# Patient Record
Sex: Male | Born: 1955 | Race: White | Hispanic: No | Marital: Married | Smoking: Never smoker
Health system: Southern US, Community
[De-identification: ages and names within clinical notes are randomized; demographics above are authoritative.]

## PROBLEM LIST (undated history)

## (undated) DIAGNOSIS — I10 Essential (primary) hypertension: Secondary | ICD-10-CM

## (undated) DIAGNOSIS — R778 Other specified abnormalities of plasma proteins: Secondary | ICD-10-CM

## (undated) DIAGNOSIS — D126 Benign neoplasm of colon, unspecified: Secondary | ICD-10-CM

## (undated) DIAGNOSIS — R6882 Decreased libido: Secondary | ICD-10-CM

## (undated) DIAGNOSIS — R432 Parageusia: Secondary | ICD-10-CM

## (undated) DIAGNOSIS — E519 Thiamine deficiency, unspecified: Secondary | ICD-10-CM

## (undated) DIAGNOSIS — D509 Iron deficiency anemia, unspecified: Secondary | ICD-10-CM

## (undated) DIAGNOSIS — F32A Depression, unspecified: Secondary | ICD-10-CM

## (undated) DIAGNOSIS — F329 Major depressive disorder, single episode, unspecified: Secondary | ICD-10-CM

## (undated) DIAGNOSIS — K449 Diaphragmatic hernia without obstruction or gangrene: Secondary | ICD-10-CM

## (undated) HISTORY — DX: Parageusia: R43.2

## (undated) HISTORY — DX: Other specified abnormalities of plasma proteins: R77.8

## (undated) HISTORY — PX: BACK SURGERY: SHX140

## (undated) HISTORY — DX: Diaphragmatic hernia without obstruction or gangrene: K44.9

## (undated) HISTORY — DX: Essential (primary) hypertension: I10

## (undated) HISTORY — DX: Decreased libido: R68.82

## (undated) HISTORY — DX: Thiamine deficiency, unspecified: E51.9

## (undated) HISTORY — DX: Iron deficiency anemia, unspecified: D50.9

## (undated) HISTORY — DX: Depression, unspecified: F32.A

## (undated) HISTORY — PX: TOE AMPUTATION: SHX809

---
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
## 2001-02-16 DIAGNOSIS — D126 Benign neoplasm of colon, unspecified: Secondary | ICD-10-CM

## 2001-02-16 HISTORY — DX: Benign neoplasm of colon, unspecified: D12.6

## 2006-08-24 ENCOUNTER — Encounter: Admission: RE | Admit: 2006-08-24 | Discharge: 2006-08-24 | Payer: Self-pay | Admitting: Neurosurgery

## 2008-06-04 ENCOUNTER — Encounter
Admission: RE | Admit: 2008-06-04 | Discharge: 2008-09-02 | Payer: Self-pay | Admitting: Physical Medicine & Rehabilitation

## 2008-06-06 ENCOUNTER — Ambulatory Visit: Payer: Self-pay | Admitting: Physical Medicine & Rehabilitation

## 2008-07-11 ENCOUNTER — Ambulatory Visit: Payer: Self-pay | Admitting: Physical Medicine & Rehabilitation

## 2008-07-26 ENCOUNTER — Encounter: Admission: RE | Admit: 2008-07-26 | Discharge: 2008-10-24 | Payer: Self-pay | Admitting: Anesthesiology

## 2008-07-31 ENCOUNTER — Ambulatory Visit: Payer: Self-pay | Admitting: Anesthesiology

## 2008-09-11 ENCOUNTER — Ambulatory Visit: Payer: Self-pay | Admitting: Anesthesiology

## 2008-10-08 ENCOUNTER — Encounter
Admission: RE | Admit: 2008-10-08 | Discharge: 2008-10-10 | Payer: Self-pay | Admitting: Physical Medicine & Rehabilitation

## 2008-10-10 ENCOUNTER — Ambulatory Visit: Payer: Self-pay | Admitting: Physical Medicine & Rehabilitation

## 2009-01-01 ENCOUNTER — Encounter
Admission: RE | Admit: 2009-01-01 | Discharge: 2009-02-07 | Payer: Self-pay | Admitting: Physical Medicine & Rehabilitation

## 2009-01-02 ENCOUNTER — Ambulatory Visit: Payer: Self-pay | Admitting: Physical Medicine & Rehabilitation

## 2009-01-09 ENCOUNTER — Encounter
Admission: RE | Admit: 2009-01-09 | Discharge: 2009-01-09 | Payer: Self-pay | Admitting: Physical Medicine & Rehabilitation

## 2009-02-22 ENCOUNTER — Encounter
Admission: RE | Admit: 2009-02-22 | Discharge: 2009-05-23 | Payer: Self-pay | Admitting: Physical Medicine & Rehabilitation

## 2009-02-25 ENCOUNTER — Ambulatory Visit: Payer: Self-pay | Admitting: Physical Medicine & Rehabilitation

## 2009-03-15 ENCOUNTER — Encounter
Admission: RE | Admit: 2009-03-15 | Discharge: 2009-03-15 | Payer: Self-pay | Admitting: Physical Medicine & Rehabilitation

## 2009-03-18 ENCOUNTER — Encounter
Admission: RE | Admit: 2009-03-18 | Discharge: 2009-06-16 | Payer: Self-pay | Admitting: Physical Medicine & Rehabilitation

## 2009-03-18 ENCOUNTER — Ambulatory Visit: Payer: Self-pay | Admitting: Physical Medicine & Rehabilitation

## 2009-04-15 ENCOUNTER — Ambulatory Visit: Payer: Self-pay | Admitting: Physical Medicine & Rehabilitation

## 2009-05-20 ENCOUNTER — Ambulatory Visit: Payer: Self-pay | Admitting: Physical Medicine & Rehabilitation

## 2009-06-17 ENCOUNTER — Encounter
Admission: RE | Admit: 2009-06-17 | Discharge: 2009-06-17 | Payer: Self-pay | Admitting: Physical Medicine & Rehabilitation

## 2009-06-17 ENCOUNTER — Ambulatory Visit: Payer: Self-pay | Admitting: Physical Medicine & Rehabilitation

## 2009-06-26 ENCOUNTER — Inpatient Hospital Stay (HOSPITAL_COMMUNITY): Admission: RE | Admit: 2009-06-26 | Discharge: 2009-07-01 | Payer: Self-pay | Admitting: Neurosurgery

## 2009-12-13 ENCOUNTER — Encounter: Admission: RE | Admit: 2009-12-13 | Discharge: 2009-12-13 | Payer: Self-pay | Admitting: Neurosurgery

## 2010-03-08 ENCOUNTER — Encounter: Payer: Self-pay | Admitting: Neurosurgery

## 2010-03-09 ENCOUNTER — Encounter: Payer: Self-pay | Admitting: Neurosurgery

## 2010-04-26 ENCOUNTER — Emergency Department (HOSPITAL_BASED_OUTPATIENT_CLINIC_OR_DEPARTMENT_OTHER)
Admission: EM | Admit: 2010-04-26 | Discharge: 2010-04-26 | Disposition: A | Discharge status: Home / Self Care | Payer: BC Managed Care – PPO | Attending: Emergency Medicine | Admitting: Emergency Medicine

## 2010-04-26 DIAGNOSIS — W899XXA Exposure to unspecified man-made visible and ultraviolet light, initial encounter: Secondary | ICD-10-CM | POA: Insufficient documentation

## 2010-04-26 DIAGNOSIS — Y92410 Unspecified street and highway as the place of occurrence of the external cause: Secondary | ICD-10-CM | POA: Insufficient documentation

## 2010-04-26 DIAGNOSIS — G8929 Other chronic pain: Secondary | ICD-10-CM | POA: Insufficient documentation

## 2010-04-26 DIAGNOSIS — I10 Essential (primary) hypertension: Secondary | ICD-10-CM | POA: Insufficient documentation

## 2010-04-26 DIAGNOSIS — L568 Other specified acute skin changes due to ultraviolet radiation: Secondary | ICD-10-CM | POA: Insufficient documentation

## 2010-05-06 LAB — DIFFERENTIAL
Eosinophils Absolute: 0 10*3/uL (ref 0.0–0.7)
Lymphocytes Relative: 8 % — ABNORMAL LOW (ref 12–46)
Lymphs Abs: 1.2 10*3/uL (ref 0.7–4.0)
Monocytes Relative: 1 % — ABNORMAL LOW (ref 3–12)
Neutrophils Relative %: 91 % — ABNORMAL HIGH (ref 43–77)

## 2010-05-06 LAB — BASIC METABOLIC PANEL
Chloride: 97 mEq/L (ref 96–112)
GFR calc Af Amer: >60 mL/min (ref >60)
GFR calc non Af Amer: >60 mL/min (ref >60)
Potassium: 3.8 mEq/L (ref 3.5–5.1)

## 2010-05-06 LAB — TYPE AND SCREEN: Antibody Screen: NEGATIVE

## 2010-05-06 LAB — CBC
HCT: 39.5 % (ref 39.0–52.0)
MCV: 91.1 fL (ref 78.0–100.0)
RBC: 4.34 MIL/uL (ref 4.22–5.81)
WBC: 14.5 10*3/uL — ABNORMAL HIGH (ref 4.0–10.5)

## 2010-05-06 LAB — SURGICAL PCR SCREEN
MRSA, PCR: NEGATIVE
Staphylococcus aureus: POSITIVE — AB

## 2010-07-01 NOTE — Procedures (Signed)
NAMECLAUDELL, WOHLER NO.:  [id number]   MEDICAL RECORD NO.:  [id number]          PATIENT TYPE:  REC   LOCATION:  [facility]                          FACILITY:  [facility]   PHYSICIAN:  Celene Kras, MD        DATE OF BIRTH:  27-Mar-1955   DATE OF PROCEDURE:  DATE OF DISCHARGE:                               OPERATIVE REPORT   Luke [facility].  I evaluated  him and reviewed the health and history form and 14-point review of  systems.  1. I have examined him, reviewed the chart, progress data, overall      directed care approach, reviewed imaging.  I think it is reasonable      to go on to facet medial branch intervention, concur with Dr.      Riley Kill, 5 and 4 most problematic levels with 3 as contributory      innervation.  I will also inject dorsal rami, 5, independent needle      access points.  He has consented.  I have used models and discussed      in lay terms.  I have given benchmarks, consider RF as an option      down the road.  2. Maintain contact with Dr. Riley Kill.  3. Follow expectantly.  Questions were answered.   OBJECTIVE:  Diffuse paralumbar myofascial, Fortin test positive with  side bending, range of motion impaired secondary to pain.  Pain with  extension.  Nothing new neurologically.   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   PLAN:  Facet medial branch intervention 5, 4, 3, right and left side  independent needle access points with contributory elements addressed,  dorsal rami 5, 5 right and left.  He has consented.   The patient was taken to the fluoroscopy suite and placed in the prone  position.  Back prepped and draped in usual fashion using a 22-gauge  spinal needle, I advanced to the facet at the medial branch, 5, 4, and  3, dorsal rami 5 right and left side.  Confirmed placement.  Then,  inject 1 mL of lidocaine 1% MPF at each level, medial branch,  independent needle access points, with a total of 40 mg Aristocort in  divided dose.   He tolerated the procedure well.  No complications from our procedure.  Discharge instructions given.  We will see him in followup.  Predicate  further intervention based on need and overall response.           ______________________________  Celene Kras, MD     HH/MEDQ  D:  07/31/2008 13:15:22  T:  08/01/2008 06:47:38  Job:  [id number]

## 2010-07-01 NOTE — Procedures (Signed)
NAMEGIOVANNE, Odonnell NO.:  [id number]   MEDICAL RECORD NO.:  [id number]           PATIENT TYPE:   LOCATION:                                 FACILITY:   PHYSICIAN:  Celene Kras, MD        DATE OF BIRTH:  [birth year]   DATE OF PROCEDURE:  09/11/2008  DATE OF DISCHARGE:                               OPERATIVE REPORT   Luke Odonnell comes to the pain management.  I evaluated him and reviewed  the Health and History form and 14-point review of systems.  1. Luke Odonnell comes to [place] today benefiting from facet intervention, we will      use longer-acting dense local anesthetic, sequentially, and he will      assess this, his benchmarks are given.  2. Modifiable features in health profile.  He wants to exhaust      conservative management.  3. I follow him expectantly, questions were answered, discussed in lay      terms.  I planned dorsal rami 5,4,3, and 2 right and left side,      independent needle access points under local anesthetic.  He is      consented for today's procedure.   Objectively, no significant interval change, diffuse paralumbar  myofascial, Fortin test positive with the side bending, range of motion  impaired secondary to pain.  Pain with extension.  Nothing new  neurologically.   IMPRESSION:  Degenerative spine disease, lumbar spine with spondylosis,  minimal myelopathy.   PLAN:  Facet medial branch intervention, above-mentioned levels under  local anesthetic and he is consented.   The patient was taken to the fluoroscopy suite and placed in prone  position.  Back was prepped and draped in usual fashion using a 22-gauge  spinal needle.  I advanced the fact as mentioned, right and left side,  and inject 1 mL of Marcaine with 0.5% MPF at each level with a total of  40 mg of Aristocort in divided doses.   He tolerated the procedure well.  No complications from our procedure.  Appropriate recovery.  Discharge instructions given.     ______________________________  Celene Kras, MD     HH/MEDQ  D:  09/11/2008 12:07:47  T:  09/12/2008 02:33:07  Job:  [id number]   cc:   Hilda Lias, M.D.  Fax: [phone]

## 2010-07-01 NOTE — Assessment & Plan Note (Signed)
I last saw Luke Odonnell in May of this spring and we sent her for medial branch  blocks.  Dr. Stevphen Rochester performed 2 sets of these bilaterally L4 through S1.  He has had a great results with these with better range of motion and  activity tolerance.  His pain is 6-7/10.  He uses the hydrocodone for  breakthrough pain usually 1-1/2-2 times a day.  He is very pleased with  his progress.  He does feel sore after the end of his work day, but  overall still remains in better shape than he was after his injection on  July 27.   REVIEW OF SYSTEMS:  Notable for numbness.  Other pertinent positives are  above and full 14-point review is in the written health and history  section of the chart.   SOCIAL HISTORY:  Unchanged.  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   PHYSICAL EXAMINATION:  VITAL SIGNS:  Blood pressure 138/86, pulse 76,  respiratory rate 16, he is sating 99% on room air.  GENERAL:  The patient is pleasant, alert, and oriented x3.  Affect is  bright and appropriate.  EXTREMITIES:  The patient was able to rise quickly from a seated to  standing position.  He is able to bend and touch his toes nearly and  then extend back to neutral position without really any pain today.  He  is mildly tender with palpation over the lower lumbar paraspinals and  facets.  Extension seem to provoke a bit more pain, but not nearly like  it did previously.  Facet maneuvers were equivocal today.  Strength is  5/5 with normal reflexes and sensory function in both legs.   ASSESSMENT:  1. Cervical lumbar post laminectomy syndrome.  2. Lumbar facet arthropathy and degenerative disk disease.   PLAN:  1. We discussed back maintenance in this patient's case.  He needs to      be aggressive with postural exercises as well as strengthening      range of motion and safety measures at work.  Need to be realistic      about his job to has his symptoms are likely to recur considering      the nature and  physicality involved with his work.  2. He can continue with Lidoderm patches and Voltaren gel for      localized pain relief.  3. He will use hydrocodone 10/500 one to two daily #50.  4. Can consider lumbar RF in the future depending on his progress.  We      will certainly hold off for now.  5. I will see him back in 3 months.      Ranelle Oyster, M.D.  Electronically Signed     ZTS/MedQ  D:  10/10/2008 15:18:09  T:  10/11/2008 21:30:86  Job #:  [id number]   cc:   Hilda Lias, M.D.  Fax: [phone]

## 2010-07-01 NOTE — Assessment & Plan Note (Signed)
Mr. Luke Odonnell is back regarding his low back pain.  He has done very well  with the Lidoderm patches and Voltaren gel.  He uses hydrocodone for  breakthrough symptoms.  We tried some of the Keppra for his  radiculopathy and he had some difficulties tolerating that.  He told me  today that radiculopathy was not so much of an issue as the low back  pain was.  He brought x-rays with him today that showed his L1  compression fracture as well as his spondylosis in the lower lumbar  spine with some calcifications and laminectomy findings.  He rates his  pain as 6/10.  Pain is aching.  Pain interferes with general activity,  relations with others, enjoyment of life on a moderate level.  Sleep is  fair.  He likes heat and alternates it with ice successfully.  He wears  his Lidoderm patch during the day and uses Voltaren gel on his back at  night.   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  Denies drinking or smoking.   REVIEW OF SYSTEMS:  Notable for occasional numbness in the left leg  which is baseline.  He denies any other issues other than that mentioned  above.  Full 14-point review is in the written health and history  section of the chart.   PHYSICAL EXAMINATION:  Blood pressure is 144/82, pulse 94, respiratory  rate 18.  He is sating 98% on room air.  The patient is pleasant, alert  and oriented x3.  Affect is bright and appropriate.  He bends forward  for me today and had some difficulty bending but almost touched his  toes.  He had difficulty extending and then lateral bend and performed  facet maneuvers and pain was prevalent bilaterally, right slightly more  than left.  He had pain at the PSIS area as well as the lower lumbar  facets.  Area was slightly tender to palpation.  He has his Lidoderm  patch in place.  Strength was generally 5/5 except for the left lower  extremity which was a bit weak at 4+/5.   Sensory exam was perhaps a bit  diminished but minimally so in left lower extremity.  Reflexes 1+ left,  1+-2+ on the right lower extremity today.  Cognitively, he is intact.  Heart is regular.  Chest is clear.  Abdomen is soft, nontender.   ASSESSMENT:  1. Cervical and lumbar postlaminectomy syndrome.  2. Lumbar facet arthropathy with degenerative disk disease.  The      patient has a L1 compression fracture as well.   PLAN:  1. I would like to have the patient go for medial branch blocks at L4-      L5 and L5-S1 bilaterally.  I think he could do very nicely with      these.  2. He will continue with Lidoderm patches as well as the Voltaren gel.  3. Hydrocodone for breakthrough pain 10/500 one q.6-8 hours p.r.n.      #60.  4. Like to pursue therapy for his low back and core musculature once      injections are performed.  5. I will see him back pending the above.      Ranelle Oyster, M.D.  Electronically Signed     ZTS/MedQ  D:  07/11/2008 10:09:02  T:  07/12/2008 01:08:46  Job #:  [id number]  cc:   Hilda Lias, M.D.  Fax: [phone]

## 2013-02-03 ENCOUNTER — Other Ambulatory Visit: Payer: Self-pay | Admitting: Neurosurgery

## 2013-02-03 DIAGNOSIS — M5412 Radiculopathy, cervical region: Secondary | ICD-10-CM

## 2013-02-08 ENCOUNTER — Ambulatory Visit
Admission: RE | Admit: 2013-02-08 | Discharge: 2013-02-08 | Disposition: A | Discharge status: Home / Self Care | Payer: BC Managed Care – PPO | Source: Ambulatory Visit | Attending: Neurosurgery | Admitting: Neurosurgery

## 2013-02-08 VITALS — BP 147/82 | HR 80

## 2013-02-08 DIAGNOSIS — M5412 Radiculopathy, cervical region: Secondary | ICD-10-CM

## 2013-02-08 MED ORDER — IOHEXOL 300 MG/ML  SOLN
10.0000 mL | Freq: Once | INTRAMUSCULAR | Status: AC | PRN
  Administered 2013-02-08: 10 mL via INTRATHECAL

## 2013-02-08 MED ORDER — DIAZEPAM 5 MG PO TABS
10.0000 mg | ORAL_TABLET | Freq: Once | ORAL | Status: AC
  Administered 2013-02-08: 10 mg via ORAL

## 2015-08-23 DIAGNOSIS — M7551 Bursitis of right shoulder: Secondary | ICD-10-CM | POA: Diagnosis not present

## 2015-08-23 DIAGNOSIS — M19019 Primary osteoarthritis, unspecified shoulder: Secondary | ICD-10-CM | POA: Diagnosis not present

## 2015-08-23 DIAGNOSIS — M19011 Primary osteoarthritis, right shoulder: Secondary | ICD-10-CM | POA: Diagnosis not present

## 2015-08-23 DIAGNOSIS — Z6824 Body mass index (BMI) 24.0-24.9, adult: Secondary | ICD-10-CM | POA: Diagnosis not present

## 2015-08-23 DIAGNOSIS — M25511 Pain in right shoulder: Secondary | ICD-10-CM | POA: Diagnosis not present

## 2015-10-14 DIAGNOSIS — M75101 Unspecified rotator cuff tear or rupture of right shoulder, not specified as traumatic: Secondary | ICD-10-CM | POA: Diagnosis not present

## 2015-10-14 DIAGNOSIS — Z6824 Body mass index (BMI) 24.0-24.9, adult: Secondary | ICD-10-CM | POA: Diagnosis not present

## 2015-10-20 DIAGNOSIS — S46011A Strain of muscle(s) and tendon(s) of the rotator cuff of right shoulder, initial encounter: Secondary | ICD-10-CM | POA: Diagnosis not present

## 2015-10-20 DIAGNOSIS — M75101 Unspecified rotator cuff tear or rupture of right shoulder, not specified as traumatic: Secondary | ICD-10-CM | POA: Diagnosis not present

## 2015-10-22 DIAGNOSIS — M75101 Unspecified rotator cuff tear or rupture of right shoulder, not specified as traumatic: Secondary | ICD-10-CM | POA: Diagnosis not present

## 2015-10-22 DIAGNOSIS — Z6824 Body mass index (BMI) 24.0-24.9, adult: Secondary | ICD-10-CM | POA: Diagnosis not present

## 2015-10-23 DIAGNOSIS — M25511 Pain in right shoulder: Secondary | ICD-10-CM | POA: Diagnosis not present

## 2015-11-21 DIAGNOSIS — M75121 Complete rotator cuff tear or rupture of right shoulder, not specified as traumatic: Secondary | ICD-10-CM | POA: Diagnosis not present

## 2015-11-21 DIAGNOSIS — M24111 Other articular cartilage disorders, right shoulder: Secondary | ICD-10-CM | POA: Diagnosis not present

## 2015-11-21 DIAGNOSIS — M7521 Bicipital tendinitis, right shoulder: Secondary | ICD-10-CM | POA: Diagnosis not present

## 2015-11-21 DIAGNOSIS — M19011 Primary osteoarthritis, right shoulder: Secondary | ICD-10-CM | POA: Diagnosis not present

## 2015-11-21 DIAGNOSIS — M7551 Bursitis of right shoulder: Secondary | ICD-10-CM | POA: Diagnosis not present

## 2015-11-21 DIAGNOSIS — S46011A Strain of muscle(s) and tendon(s) of the rotator cuff of right shoulder, initial encounter: Secondary | ICD-10-CM | POA: Diagnosis not present

## 2015-11-21 DIAGNOSIS — M7541 Impingement syndrome of right shoulder: Secondary | ICD-10-CM | POA: Diagnosis not present

## 2015-11-21 DIAGNOSIS — S43431A Superior glenoid labrum lesion of right shoulder, initial encounter: Secondary | ICD-10-CM | POA: Diagnosis not present

## 2015-11-26 DIAGNOSIS — M75121 Complete rotator cuff tear or rupture of right shoulder, not specified as traumatic: Secondary | ICD-10-CM | POA: Diagnosis not present

## 2015-11-26 DIAGNOSIS — M25511 Pain in right shoulder: Secondary | ICD-10-CM | POA: Diagnosis not present

## 2015-11-26 DIAGNOSIS — M25611 Stiffness of right shoulder, not elsewhere classified: Secondary | ICD-10-CM | POA: Diagnosis not present

## 2015-12-02 DIAGNOSIS — M25511 Pain in right shoulder: Secondary | ICD-10-CM | POA: Diagnosis not present

## 2015-12-03 DIAGNOSIS — M25611 Stiffness of right shoulder, not elsewhere classified: Secondary | ICD-10-CM | POA: Diagnosis not present

## 2015-12-03 DIAGNOSIS — M25511 Pain in right shoulder: Secondary | ICD-10-CM | POA: Diagnosis not present

## 2015-12-03 DIAGNOSIS — M75121 Complete rotator cuff tear or rupture of right shoulder, not specified as traumatic: Secondary | ICD-10-CM | POA: Diagnosis not present

## 2015-12-11 DIAGNOSIS — M75121 Complete rotator cuff tear or rupture of right shoulder, not specified as traumatic: Secondary | ICD-10-CM | POA: Diagnosis not present

## 2015-12-11 DIAGNOSIS — M25511 Pain in right shoulder: Secondary | ICD-10-CM | POA: Diagnosis not present

## 2015-12-11 DIAGNOSIS — M25611 Stiffness of right shoulder, not elsewhere classified: Secondary | ICD-10-CM | POA: Diagnosis not present

## 2016-01-01 DIAGNOSIS — M25511 Pain in right shoulder: Secondary | ICD-10-CM | POA: Diagnosis not present

## 2016-01-01 DIAGNOSIS — M75121 Complete rotator cuff tear or rupture of right shoulder, not specified as traumatic: Secondary | ICD-10-CM | POA: Diagnosis not present

## 2016-01-01 DIAGNOSIS — M25611 Stiffness of right shoulder, not elsewhere classified: Secondary | ICD-10-CM | POA: Diagnosis not present

## 2016-02-04 DIAGNOSIS — Z6825 Body mass index (BMI) 25.0-25.9, adult: Secondary | ICD-10-CM | POA: Diagnosis not present

## 2016-02-04 DIAGNOSIS — I10 Essential (primary) hypertension: Secondary | ICD-10-CM | POA: Diagnosis not present

## 2016-02-04 DIAGNOSIS — R05 Cough: Secondary | ICD-10-CM | POA: Diagnosis not present

## 2016-02-12 DIAGNOSIS — M25511 Pain in right shoulder: Secondary | ICD-10-CM | POA: Diagnosis not present

## 2016-05-23 DIAGNOSIS — R200 Anesthesia of skin: Secondary | ICD-10-CM | POA: Diagnosis not present

## 2016-05-23 DIAGNOSIS — M7550 Bursitis of unspecified shoulder: Secondary | ICD-10-CM | POA: Diagnosis not present

## 2016-05-23 DIAGNOSIS — E785 Hyperlipidemia, unspecified: Secondary | ICD-10-CM | POA: Diagnosis not present

## 2016-05-23 DIAGNOSIS — R4781 Slurred speech: Secondary | ICD-10-CM | POA: Diagnosis not present

## 2016-05-23 DIAGNOSIS — Z823 Family history of stroke: Secondary | ICD-10-CM | POA: Diagnosis not present

## 2016-05-23 DIAGNOSIS — F0390 Unspecified dementia without behavioral disturbance: Secondary | ICD-10-CM | POA: Diagnosis not present

## 2016-05-23 DIAGNOSIS — B029 Zoster without complications: Secondary | ICD-10-CM | POA: Diagnosis not present

## 2016-05-23 DIAGNOSIS — T22219S Burn of second degree of unspecified forearm, sequela: Secondary | ICD-10-CM | POA: Diagnosis not present

## 2016-05-23 DIAGNOSIS — Z8249 Family history of ischemic heart disease and other diseases of the circulatory system: Secondary | ICD-10-CM | POA: Diagnosis not present

## 2016-05-23 DIAGNOSIS — J45909 Unspecified asthma, uncomplicated: Secondary | ICD-10-CM | POA: Diagnosis not present

## 2016-05-23 DIAGNOSIS — G459 Transient cerebral ischemic attack, unspecified: Secondary | ICD-10-CM | POA: Diagnosis not present

## 2016-05-23 DIAGNOSIS — I10 Essential (primary) hypertension: Secondary | ICD-10-CM | POA: Diagnosis not present

## 2016-05-23 DIAGNOSIS — T23079S Burn of unspecified degree of unspecified wrist, sequela: Secondary | ICD-10-CM | POA: Diagnosis not present

## 2016-05-23 DIAGNOSIS — G894 Chronic pain syndrome: Secondary | ICD-10-CM | POA: Diagnosis not present

## 2016-05-23 DIAGNOSIS — R202 Paresthesia of skin: Secondary | ICD-10-CM | POA: Diagnosis not present

## 2016-05-23 DIAGNOSIS — R079 Chest pain, unspecified: Secondary | ICD-10-CM | POA: Diagnosis not present

## 2016-05-23 DIAGNOSIS — K635 Polyp of colon: Secondary | ICD-10-CM | POA: Diagnosis not present

## 2016-05-24 DIAGNOSIS — R200 Anesthesia of skin: Secondary | ICD-10-CM | POA: Diagnosis not present

## 2016-05-24 DIAGNOSIS — R079 Chest pain, unspecified: Secondary | ICD-10-CM | POA: Diagnosis not present

## 2016-05-24 DIAGNOSIS — R4781 Slurred speech: Secondary | ICD-10-CM | POA: Diagnosis not present

## 2016-05-24 DIAGNOSIS — I10 Essential (primary) hypertension: Secondary | ICD-10-CM | POA: Diagnosis not present

## 2016-05-28 DIAGNOSIS — R079 Chest pain, unspecified: Secondary | ICD-10-CM | POA: Diagnosis not present

## 2016-05-28 DIAGNOSIS — F419 Anxiety disorder, unspecified: Secondary | ICD-10-CM | POA: Diagnosis not present

## 2016-05-28 DIAGNOSIS — F329 Major depressive disorder, single episode, unspecified: Secondary | ICD-10-CM | POA: Diagnosis not present

## 2016-05-28 DIAGNOSIS — Z09 Encounter for follow-up examination after completed treatment for conditions other than malignant neoplasm: Secondary | ICD-10-CM | POA: Diagnosis not present

## 2016-06-02 DIAGNOSIS — R079 Chest pain, unspecified: Secondary | ICD-10-CM | POA: Diagnosis not present

## 2016-06-02 DIAGNOSIS — R0789 Other chest pain: Secondary | ICD-10-CM | POA: Diagnosis not present

## 2016-07-16 DIAGNOSIS — R4781 Slurred speech: Secondary | ICD-10-CM | POA: Diagnosis not present

## 2016-07-16 DIAGNOSIS — K635 Polyp of colon: Secondary | ICD-10-CM | POA: Diagnosis not present

## 2016-07-16 DIAGNOSIS — F329 Major depressive disorder, single episode, unspecified: Secondary | ICD-10-CM | POA: Diagnosis not present

## 2016-07-16 DIAGNOSIS — I10 Essential (primary) hypertension: Secondary | ICD-10-CM | POA: Diagnosis not present

## 2016-07-20 DIAGNOSIS — Z0000 Encounter for general adult medical examination without abnormal findings: Secondary | ICD-10-CM | POA: Diagnosis not present

## 2016-07-20 DIAGNOSIS — I10 Essential (primary) hypertension: Secondary | ICD-10-CM | POA: Diagnosis not present

## 2016-07-20 DIAGNOSIS — E785 Hyperlipidemia, unspecified: Secondary | ICD-10-CM | POA: Diagnosis not present

## 2016-08-20 DIAGNOSIS — D120 Benign neoplasm of cecum: Secondary | ICD-10-CM | POA: Diagnosis not present

## 2016-08-20 DIAGNOSIS — Z8371 Family history of colonic polyps: Secondary | ICD-10-CM | POA: Diagnosis not present

## 2016-08-20 DIAGNOSIS — D123 Benign neoplasm of transverse colon: Secondary | ICD-10-CM | POA: Diagnosis not present

## 2016-08-20 DIAGNOSIS — Z1211 Encounter for screening for malignant neoplasm of colon: Secondary | ICD-10-CM | POA: Diagnosis not present

## 2016-08-20 DIAGNOSIS — K621 Rectal polyp: Secondary | ICD-10-CM | POA: Diagnosis not present

## 2016-08-20 DIAGNOSIS — Z86010 Personal history of colonic polyps: Secondary | ICD-10-CM | POA: Diagnosis not present

## 2016-08-20 DIAGNOSIS — K648 Other hemorrhoids: Secondary | ICD-10-CM | POA: Diagnosis not present

## 2016-08-27 DIAGNOSIS — I10 Essential (primary) hypertension: Secondary | ICD-10-CM | POA: Diagnosis not present

## 2016-08-27 DIAGNOSIS — R110 Nausea: Secondary | ICD-10-CM | POA: Diagnosis not present

## 2016-08-27 DIAGNOSIS — I679 Cerebrovascular disease, unspecified: Secondary | ICD-10-CM | POA: Diagnosis not present

## 2016-08-27 DIAGNOSIS — K635 Polyp of colon: Secondary | ICD-10-CM | POA: Diagnosis not present

## 2017-03-18 ENCOUNTER — Ambulatory Visit: Payer: Self-pay | Admitting: Family Medicine

## 2017-03-18 ENCOUNTER — Encounter: Payer: Self-pay | Admitting: Family Medicine

## 2017-03-18 ENCOUNTER — Ambulatory Visit (INDEPENDENT_AMBULATORY_CARE_PROVIDER_SITE_OTHER): Payer: BLUE CROSS/BLUE SHIELD | Admitting: Family Medicine

## 2017-03-18 VITALS — BP 138/90 | HR 94 | Ht 68.0 in | Wt 165.0 lb

## 2017-03-18 DIAGNOSIS — I10 Essential (primary) hypertension: Secondary | ICD-10-CM

## 2017-03-18 DIAGNOSIS — F325 Major depressive disorder, single episode, in full remission: Secondary | ICD-10-CM

## 2017-03-18 DIAGNOSIS — G8929 Other chronic pain: Secondary | ICD-10-CM | POA: Diagnosis not present

## 2017-03-18 DIAGNOSIS — M545 Low back pain: Secondary | ICD-10-CM | POA: Diagnosis not present

## 2017-03-18 DIAGNOSIS — E7800 Pure hypercholesterolemia, unspecified: Secondary | ICD-10-CM

## 2017-03-18 DIAGNOSIS — D649 Anemia, unspecified: Secondary | ICD-10-CM

## 2017-03-18 DIAGNOSIS — R0981 Nasal congestion: Secondary | ICD-10-CM

## 2017-03-18 DIAGNOSIS — J40 Bronchitis, not specified as acute or chronic: Secondary | ICD-10-CM | POA: Diagnosis not present

## 2017-03-18 DIAGNOSIS — Z0000 Encounter for general adult medical examination without abnormal findings: Secondary | ICD-10-CM | POA: Diagnosis not present

## 2017-03-18 MED ORDER — PAROXETINE HCL 20 MG PO TABS: 20.0000 mg | ORAL_TABLET | Freq: Every day | ORAL | 1 refills | Status: DC

## 2017-03-18 MED ORDER — FLUTICASONE PROPIONATE 50 MCG/ACT NA SUSP: 2.0000 | Freq: Every day | NASAL | 6 refills | Status: DC

## 2017-03-18 MED ORDER — VALSARTAN-HYDROCHLOROTHIAZIDE 160-25 MG PO TABS: 1.0000 | ORAL_TABLET | Freq: Every day | ORAL | 1 refills | Status: DC

## 2017-03-18 MED ORDER — AZITHROMYCIN 250 MG PO TABS: ORAL_TABLET | ORAL | 0 refills | Status: DC

## 2017-03-18 NOTE — Progress Notes (Signed)
Subjective:  Patient ID: Luke Odonnell, male    DOB: [birth year]  Age: 62 y.o. MRN: [id number]  CC: Establish Care   HPI CHASTEN BLAZE presents for the establishment of care and follow-up of below listed problems.  He is well-known to me as I follow him for some time now.  His blood pressures been well controlled on the valsartan/HCTZ.  His last lipid profile showed elevated elevated triglycerides with a normal LDL.  He is taking thousand milligrams of fish oil at this time and is no longer taking a statin.  His depression is been well controlled with the Paxil and is he is no longer feeling sad and having frequent crying spells.  He has been using the Flonase daily due to chronic sinus disease in his maxillary sinuses.  His chronic nasal congestion is well controlled with this medication.  He has chronic lower back pain and neck pain to surgery and both of these areas.  He is using a small dose of Suboxone to control this.  He is taking 2 mg of this medication daily.  There is been some decline in his hemoglobin and red blood cells.  The thinking it was due to this medicine.  He had a colonoscopy back in July of this year that did show small polyps.  They were benign.  For the last week or so is he has had a cough productive of purulent phlegm.  There is been no wheezing or fever or chills.  Havard does not smoke, drink alcohol or use illicit drugs.  He continues to work 40-50 hours a week at [employer].  He hopes to continue working until age 44.  His wife Ivin Booty needs surgical clearance for an upcoming left knee replacement.  Outpatient Medications Prior to Visit  Medication Sig Dispense Refill  . aspirin EC 81 MG tablet Take 81 mg by mouth daily.    . pravastatin (PRAVACHOL) 20 MG tablet Take 1 tablet by mouth daily.    . tamsulosin (FLOMAX) 0.4 MG CAPS capsule Take 1 capsule by mouth daily.    Marland Kitchen PARoxetine (PAXIL) 20 MG tablet Take 1 tablet by mouth daily.  1  . valsartan-hydrochlorothiazide  (DIOVAN-HCT) 160-25 MG tablet Take 1 tablet by mouth daily.     No facility-administered medications prior to visit.     ROS Review of Systems  Constitutional: Negative for activity change, chills, fatigue, fever and unexpected weight change.  HENT: Positive for congestion. Negative for ear pain, postnasal drip, rhinorrhea, sinus pressure, sinus pain, sneezing and sore throat.   Eyes: Negative for photophobia and visual disturbance.  Respiratory: Positive for cough. Negative for chest tightness, shortness of breath and wheezing.   Cardiovascular: Negative for chest pain, palpitations and leg swelling.  Gastrointestinal: Negative.   Endocrine: Negative for polyphagia and polyuria.  Genitourinary: Negative for difficulty urinating, frequency and urgency.  Musculoskeletal: Positive for back pain and neck pain. Negative for arthralgias and gait problem.  Skin: Negative for color change and rash.  Allergic/Immunologic: Negative for immunocompromised state.  Neurological: Negative for weakness and headaches.  Hematological: Does not bruise/bleed easily.  Psychiatric/Behavioral: Negative for behavioral problems, decreased concentration and dysphoric mood. The patient is not nervous/anxious.     Objective:  BP 138/90 (BP Location: Left Arm, Patient Position: Sitting, Cuff Size: Normal)   Pulse 94   Ht 5\' 8"  (1.727 m)   Wt 165 lb (74.8 kg)   SpO2 97%   BMI 25.09 kg/m   BP Readings  from Last 3 Encounters:  03/18/17 138/90  02/08/13 (!) 147/82    Wt Readings from Last 3 Encounters:  03/18/17 165 lb (74.8 kg)    Physical Exam  Constitutional: He is oriented to person, place, and time. He appears well-developed and well-nourished. No distress.  HENT:  Head: Normocephalic and atraumatic.  Right Ear: External ear normal.  Left Ear: External ear normal.  Mouth/Throat: Oropharynx is clear and moist. No oropharyngeal exudate.  Eyes: Conjunctivae are normal. Pupils are equal, round, and  reactive to light. Right eye exhibits no discharge. Left eye exhibits no discharge. No scleral icterus.  Neck: Normal range of motion. Neck supple. No JVD present. No tracheal deviation present. No thyromegaly present.  Cardiovascular: Normal rate, regular rhythm and normal heart sounds.  Pulmonary/Chest: Effort normal. No stridor. No respiratory distress. He has no decreased breath sounds. He has no wheezes. He has no rhonchi. He has no rales.  Abdominal: Bowel sounds are normal. He exhibits no distension. There is no tenderness. There is no rebound and no guarding.  Genitourinary: Rectal exam shows no external hemorrhoid, no internal hemorrhoid, no fissure, no mass, no tenderness, anal tone normal and guaiac negative stool. Prostate is not enlarged and not tender.  Musculoskeletal: He exhibits no edema or tenderness.  Lymphadenopathy:    He has no cervical adenopathy.  Neurological: He is alert and oriented to person, place, and time.  Skin: Skin is warm and dry. No rash noted. He is not diaphoretic. No erythema.  Psychiatric: He has a normal mood and affect. His behavior is normal.    Lab Results  Component Value Date   WBC 14.5 (H) 06/24/2009   HGB 13.5 06/24/2009   HCT 39.5 06/24/2009   PLT 427 (H) 06/24/2009   GLUCOSE 112 (H) 06/24/2009   NA 135 06/24/2009   K 3.8 06/24/2009   CL 97 06/24/2009   CREATININE 1.00 06/24/2009   BUN 15 06/24/2009   CO2 31 06/24/2009    Ct Cervical Spine W Contrast  Result Date: 02/08/2013 CLINICAL DATA:  Low back pain. Neck pain. Previous surgery. Right C7 radiculopathy. TECHNIQUE: Contiguous axial images were obtained through the Cervical and Lumbar spine after the intrathecal infusion of infusion. Coronal and sagittal reconstructions were obtained of the axial image sets. FLUOROSCOPY TIME:  2 minutes. PROCEDURE: LUMBAR PUNCTURE FOR CERVICAL AND LUMBAR MYELOGRAM CERVICAL AND LUMBAR MYELOGRAM CT CERVICAL MYELOGRAM CT LUMBAR MYELOGRAM After thorough  discussion of risks and benefits of the procedure including bleeding, infection, injury to nerves, blood vessels, adjacent structures as well as headache and CSF leak, written and oral informed consent was obtained. Consent was obtained by Dr. Rolla Flatten. Patient was positioned prone on the fluoroscopy table. Local anesthesia was provided with 1% lidocaine without epinephrine after prepped and draped in the usual sterile fashion. Puncture was performed at L4-5 using a 3 1/2 inch 22-gauge spinal needle via right paramedian approach. Using a single pass through the dura, the needle was placed within the thecal sac, with return of clear CSF. 10 mL of Omnipaque-300 was injected into the thecal sac, with normal opacification of the nerve roots and cauda equina consistent with free flow within the subarachnoid space. Lumbar films were obtained in multiple projections. The patient was then moved to the trendelenburg position and contrast flowed into the Cervical spine region. Following this, flexion extension radiographs were performed in the cervical and lumbar region. I personally performed the lumbar puncture and administered the intrathecal contrast. I also personally supervised acquisition  of the myelogram images. FINDINGS: CERVICAL AND LUMBAR MYELOGRAM FINDINGS: Prior discectomy and fusion at L5-S1. Fusion appears solid. Possible left L5 and S1 conjoined nerves versus postoperative change. Mild to moderate stenosis at L1 to due to combination of old compression deformity with retropulsion as well as a partially calcified central and rightward protrusion. No lumbar dynamic instability. Hardware intact. No conus compression, as the conus ends mid L1. Mild vascular calcification. Good opacification cervical subarachnoid space. Severe disc space narrowing C3-C4 with anterior spurring. Severe disc space narrowing C7-T1 also. Shallow ventral defect C7-T1. Extradural defects at C4-5 and C6-7 both on the right. Left-sided  extradural defect at C3-C4. No dynamic instability. CT CERVICAL MYELOGRAM FINDINGS: Unremarkable craniocervical junction. No worrisome osseous lesion. Mild carotid bifurcation calcification. Bilateral thyroid cystic lesions incompletely evaluated. No lung apex lesion. The individual disc spaces were examined as follows: C2-3:  Normal. C3-4: Asymmetric facet arthropathy and uncinate spurring on the left with severe disc space narrowing and anterior spurring projecting into the retropharynx. Left C4 nerve root impingement is observed. C4-5: Mild bulge. Right greater than left uncinate spurring and facet arthropathy. Mild Right C5 nerve root impingement. C5-6:  Mild bulge.  Mild facet arthropathy.  No impingement. C6-7: Central and rightward protrusion with uncinate spurring. Mild bilateral facet arthropathy. Right C7 nerve root impingement. C7-T1: Postsurgical changes on the left. Advanced disc space narrowing. Bilateral uncinate spurring narrows the foramina, left worse than right. Left greater than right C8 nerve root impingement is likely. Compared with 12/13/2009, the findings have mildly progressed at C6-C7. CT LUMBAR MYELOGRAM FINDINGS: Mild atheromatous change of the aorta without aneurysmal dilatation. No hydronephrosis. No adenopathy. The individual disc spaces were examined with axial images as follows: L1-L2: Severe L1 compression deformity is a chronic finding related to old fall. There is retropulsion of L1 inferiorly right greater than left with associated calcified central and rightward extrusion. Mild facet arthropathy is present. Right greater than left L1 and L2 nerve root impingement are observed along with mild to moderate central canal stenosis, particularly on the right. L2-L3: Mild bulge. Slight anterior spurring. Mild facet arthropathy. No impingement. L3-L4: No posterior disc protrusion. Slight anterior spurring. Mild facet arthropathy without impingement. L4-L5: Mild bulge, with advanced  facet and ligamentum flavum hypertrophy representing adjacent segment disease. Bilateral foraminal and extraforaminal disc material without clear-cut L4 or L5 nerve root impingement. L5-S1: Solid fusion. Adequate posterior decompression. Hardware intact and appropriately placed. No L5 or S1 nerve root compression. Normal-appearing sacroiliac joints. Compared with 12/13/2009, the findings appear similar. IMPRESSION: Chronic lumbar spondylosis. Solid L5-S1 fusion. Right-sided neural impingement at L1-2 related to chronic L1 compression deformity as well as a calcified central and rightward protrusion similar to 2011. Multilevel cervical spondylosis, most notable at C6-7 on the right for C7 nerve root impingement. Chronic postsurgical change at C7-T1, left. Mild progression from 2011. Electronically Signed   By: Rolla Flatten M.D.   On: 02/08/2013 12:31   Ct Lumbar Spine W Contrast  Result Date: 02/08/2013 CLINICAL DATA:  Low back pain. Neck pain. Previous surgery. Right C7 radiculopathy. TECHNIQUE: Contiguous axial images were obtained through the Cervical and Lumbar spine after the intrathecal infusion of infusion. Coronal and sagittal reconstructions were obtained of the axial image sets. FLUOROSCOPY TIME:  2 minutes. PROCEDURE: LUMBAR PUNCTURE FOR CERVICAL AND LUMBAR MYELOGRAM CERVICAL AND LUMBAR MYELOGRAM CT CERVICAL MYELOGRAM CT LUMBAR MYELOGRAM After thorough discussion of risks and benefits of the procedure including bleeding, infection, injury to nerves, blood vessels, adjacent structures as  well as headache and CSF leak, written and oral informed consent was obtained. Consent was obtained by Dr. Rolla Flatten. Patient was positioned prone on the fluoroscopy table. Local anesthesia was provided with 1% lidocaine without epinephrine after prepped and draped in the usual sterile fashion. Puncture was performed at L4-5 using a 3 1/2 inch 22-gauge spinal needle via right paramedian approach. Using a single pass  through the dura, the needle was placed within the thecal sac, with return of clear CSF. 10 mL of Omnipaque-300 was injected into the thecal sac, with normal opacification of the nerve roots and cauda equina consistent with free flow within the subarachnoid space. Lumbar films were obtained in multiple projections. The patient was then moved to the trendelenburg position and contrast flowed into the Cervical spine region. Following this, flexion extension radiographs were performed in the cervical and lumbar region. I personally performed the lumbar puncture and administered the intrathecal contrast. I also personally supervised acquisition of the myelogram images. FINDINGS: CERVICAL AND LUMBAR MYELOGRAM FINDINGS: Prior discectomy and fusion at L5-S1. Fusion appears solid. Possible left L5 and S1 conjoined nerves versus postoperative change. Mild to moderate stenosis at L1 to due to combination of old compression deformity with retropulsion as well as a partially calcified central and rightward protrusion. No lumbar dynamic instability. Hardware intact. No conus compression, as the conus ends mid L1. Mild vascular calcification. Good opacification cervical subarachnoid space. Severe disc space narrowing C3-C4 with anterior spurring. Severe disc space narrowing C7-T1 also. Shallow ventral defect C7-T1. Extradural defects at C4-5 and C6-7 both on the right. Left-sided extradural defect at C3-C4. No dynamic instability. CT CERVICAL MYELOGRAM FINDINGS: Unremarkable craniocervical junction. No worrisome osseous lesion. Mild carotid bifurcation calcification. Bilateral thyroid cystic lesions incompletely evaluated. No lung apex lesion. The individual disc spaces were examined as follows: C2-3:  Normal. C3-4: Asymmetric facet arthropathy and uncinate spurring on the left with severe disc space narrowing and anterior spurring projecting into the retropharynx. Left C4 nerve root impingement is observed. C4-5: Mild bulge.  Right greater than left uncinate spurring and facet arthropathy. Mild Right C5 nerve root impingement. C5-6:  Mild bulge.  Mild facet arthropathy.  No impingement. C6-7: Central and rightward protrusion with uncinate spurring. Mild bilateral facet arthropathy. Right C7 nerve root impingement. C7-T1: Postsurgical changes on the left. Advanced disc space narrowing. Bilateral uncinate spurring narrows the foramina, left worse than right. Left greater than right C8 nerve root impingement is likely. Compared with 12/13/2009, the findings have mildly progressed at C6-C7. CT LUMBAR MYELOGRAM FINDINGS: Mild atheromatous change of the aorta without aneurysmal dilatation. No hydronephrosis. No adenopathy. The individual disc spaces were examined with axial images as follows: L1-L2: Severe L1 compression deformity is a chronic finding related to old fall. There is retropulsion of L1 inferiorly right greater than left with associated calcified central and rightward extrusion. Mild facet arthropathy is present. Right greater than left L1 and L2 nerve root impingement are observed along with mild to moderate central canal stenosis, particularly on the right. L2-L3: Mild bulge. Slight anterior spurring. Mild facet arthropathy. No impingement. L3-L4: No posterior disc protrusion. Slight anterior spurring. Mild facet arthropathy without impingement. L4-L5: Mild bulge, with advanced facet and ligamentum flavum hypertrophy representing adjacent segment disease. Bilateral foraminal and extraforaminal disc material without clear-cut L4 or L5 nerve root impingement. L5-S1: Solid fusion. Adequate posterior decompression. Hardware intact and appropriately placed. No L5 or S1 nerve root compression. Normal-appearing sacroiliac joints. Compared with 12/13/2009, the findings appear similar. IMPRESSION: Chronic  lumbar spondylosis. Solid L5-S1 fusion. Right-sided neural impingement at L1-2 related to chronic L1 compression deformity as well as  a calcified central and rightward protrusion similar to 2011. Multilevel cervical spondylosis, most notable at C6-7 on the right for C7 nerve root impingement. Chronic postsurgical change at C7-T1, left. Mild progression from 2011. Electronically Signed   By: Rolla Flatten M.D.   On: 02/08/2013 12:31   Dg Myelogram 2+ Regions  Result Date: 02/08/2013 CLINICAL DATA:  Low back pain. Neck pain. Previous surgery. Right C7 radiculopathy. TECHNIQUE: Contiguous axial images were obtained through the Cervical and Lumbar spine after the intrathecal infusion of infusion. Coronal and sagittal reconstructions were obtained of the axial image sets. FLUOROSCOPY TIME:  2 minutes. PROCEDURE: LUMBAR PUNCTURE FOR CERVICAL AND LUMBAR MYELOGRAM CERVICAL AND LUMBAR MYELOGRAM CT CERVICAL MYELOGRAM CT LUMBAR MYELOGRAM After thorough discussion of risks and benefits of the procedure including bleeding, infection, injury to nerves, blood vessels, adjacent structures as well as headache and CSF leak, written and oral informed consent was obtained. Consent was obtained by Dr. Rolla Flatten. Patient was positioned prone on the fluoroscopy table. Local anesthesia was provided with 1% lidocaine without epinephrine after prepped and draped in the usual sterile fashion. Puncture was performed at L4-5 using a 3 1/2 inch 22-gauge spinal needle via right paramedian approach. Using a single pass through the dura, the needle was placed within the thecal sac, with return of clear CSF. 10 mL of Omnipaque-300 was injected into the thecal sac, with normal opacification of the nerve roots and cauda equina consistent with free flow within the subarachnoid space. Lumbar films were obtained in multiple projections. The patient was then moved to the trendelenburg position and contrast flowed into the Cervical spine region. Following this, flexion extension radiographs were performed in the cervical and lumbar region. I personally performed the lumbar puncture  and administered the intrathecal contrast. I also personally supervised acquisition of the myelogram images. FINDINGS: CERVICAL AND LUMBAR MYELOGRAM FINDINGS: Prior discectomy and fusion at L5-S1. Fusion appears solid. Possible left L5 and S1 conjoined nerves versus postoperative change. Mild to moderate stenosis at L1 to due to combination of old compression deformity with retropulsion as well as a partially calcified central and rightward protrusion. No lumbar dynamic instability. Hardware intact. No conus compression, as the conus ends mid L1. Mild vascular calcification. Good opacification cervical subarachnoid space. Severe disc space narrowing C3-C4 with anterior spurring. Severe disc space narrowing C7-T1 also. Shallow ventral defect C7-T1. Extradural defects at C4-5 and C6-7 both on the right. Left-sided extradural defect at C3-C4. No dynamic instability. CT CERVICAL MYELOGRAM FINDINGS: Unremarkable craniocervical junction. No worrisome osseous lesion. Mild carotid bifurcation calcification. Bilateral thyroid cystic lesions incompletely evaluated. No lung apex lesion. The individual disc spaces were examined as follows: C2-3:  Normal. C3-4: Asymmetric facet arthropathy and uncinate spurring on the left with severe disc space narrowing and anterior spurring projecting into the retropharynx. Left C4 nerve root impingement is observed. C4-5: Mild bulge. Right greater than left uncinate spurring and facet arthropathy. Mild Right C5 nerve root impingement. C5-6:  Mild bulge.  Mild facet arthropathy.  No impingement. C6-7: Central and rightward protrusion with uncinate spurring. Mild bilateral facet arthropathy. Right C7 nerve root impingement. C7-T1: Postsurgical changes on the left. Advanced disc space narrowing. Bilateral uncinate spurring narrows the foramina, left worse than right. Left greater than right C8 nerve root impingement is likely. Compared with 12/13/2009, the findings have mildly progressed at  C6-C7. CT LUMBAR MYELOGRAM FINDINGS: Mild  atheromatous change of the aorta without aneurysmal dilatation. No hydronephrosis. No adenopathy. The individual disc spaces were examined with axial images as follows: L1-L2: Severe L1 compression deformity is a chronic finding related to old fall. There is retropulsion of L1 inferiorly right greater than left with associated calcified central and rightward extrusion. Mild facet arthropathy is present. Right greater than left L1 and L2 nerve root impingement are observed along with mild to moderate central canal stenosis, particularly on the right. L2-L3: Mild bulge. Slight anterior spurring. Mild facet arthropathy. No impingement. L3-L4: No posterior disc protrusion. Slight anterior spurring. Mild facet arthropathy without impingement. L4-L5: Mild bulge, with advanced facet and ligamentum flavum hypertrophy representing adjacent segment disease. Bilateral foraminal and extraforaminal disc material without clear-cut L4 or L5 nerve root impingement. L5-S1: Solid fusion. Adequate posterior decompression. Hardware intact and appropriately placed. No L5 or S1 nerve root compression. Normal-appearing sacroiliac joints. Compared with 12/13/2009, the findings appear similar. IMPRESSION: Chronic lumbar spondylosis. Solid L5-S1 fusion. Right-sided neural impingement at L1-2 related to chronic L1 compression deformity as well as a calcified central and rightward protrusion similar to 2011. Multilevel cervical spondylosis, most notable at C6-7 on the right for C7 nerve root impingement. Chronic postsurgical change at C7-T1, left. Mild progression from 2011. Electronically Signed   By: Rolla Flatten M.D.   On: 02/08/2013 12:31    Assessment & Plan:   Ananda was seen today for establish care.  Diagnoses and all orders for this visit:  Essential hypertension -     CBC; Future -     Comprehensive metabolic panel; Future -     TSH; Future -     Urinalysis, Routine w reflex  microscopic; Future -     valsartan-hydrochlorothiazide (DIOVAN-HCT) 160-25 MG tablet; Take 1 tablet by mouth daily.  Nasal congestion -     fluticasone (FLONASE) 50 MCG/ACT nasal spray; Place 2 sprays into both nostrils daily.  Bronchitis -     CBC; Future -     azithromycin (ZITHROMAX) 250 MG tablet; Take two pills today and then one each day until finished.  Chronic midline low back pain without sciatica  Health care maintenance -     CBC; Future -     Comprehensive metabolic panel; Future -     HIV antibody; Future -     PSA; Future -     Urinalysis, Routine w reflex microscopic; Future  Anemia, unspecified type -     Iron, TIBC and Ferritin Panel; Future -     B12 and Folate Panel; Future  Elevated cholesterol -     Lipid panel; Future  Depression, major, single episode, complete remission (HCC) -     PARoxetine (PAXIL) 20 MG tablet; Take 1 tablet (20 mg total) by mouth daily.   I have discontinued Rashaud Ybarbo. Newborn's tamsulosin and pravastatin. I have also changed his PARoxetine. Additionally, I am having him start on fluticasone and azithromycin. Lastly, I am having him maintain his aspirin EC and valsartan-hydrochlorothiazide.  Meds ordered this encounter  Medications  . valsartan-hydrochlorothiazide (DIOVAN-HCT) 160-25 MG tablet    Sig: Take 1 tablet by mouth daily.    Dispense:  90 tablet    Refill:  1  . PARoxetine (PAXIL) 20 MG tablet    Sig: Take 1 tablet (20 mg total) by mouth daily.    Dispense:  90 tablet    Refill:  1  . fluticasone (FLONASE) 50 MCG/ACT nasal spray    Sig: Place 2 sprays into  both nostrils daily.    Dispense:  16 g    Refill:  6  . azithromycin (ZITHROMAX) 250 MG tablet    Sig: Take two pills today and then one each day until finished.    Dispense:  6 tablet    Refill:  0     Follow-up: No Follow-up on file.  Libby Maw, MD Patient will return fasting for his blood work.  Treatment and follow-up of his elevated  cholesterol will be based on lab results.  He will follow-up in a week if his chest cold does not improve with Zithromax.

## 2017-03-26 ENCOUNTER — Other Ambulatory Visit (INDEPENDENT_AMBULATORY_CARE_PROVIDER_SITE_OTHER): Payer: BLUE CROSS/BLUE SHIELD

## 2017-03-26 ENCOUNTER — Other Ambulatory Visit: Payer: BLUE CROSS/BLUE SHIELD

## 2017-03-26 DIAGNOSIS — J40 Bronchitis, not specified as acute or chronic: Secondary | ICD-10-CM | POA: Diagnosis not present

## 2017-03-26 DIAGNOSIS — Z0000 Encounter for general adult medical examination without abnormal findings: Secondary | ICD-10-CM | POA: Diagnosis not present

## 2017-03-26 DIAGNOSIS — I10 Essential (primary) hypertension: Secondary | ICD-10-CM | POA: Diagnosis not present

## 2017-03-26 DIAGNOSIS — E7800 Pure hypercholesterolemia, unspecified: Secondary | ICD-10-CM

## 2017-03-26 DIAGNOSIS — D649 Anemia, unspecified: Secondary | ICD-10-CM

## 2017-03-26 LAB — COMPREHENSIVE METABOLIC PANEL
ALT: 16 U/L (ref 0–53)
AST: 17 U/L (ref 0–37)
Albumin: 4.2 g/dL (ref 3.5–5.2)
Alkaline Phosphatase: 91 U/L (ref 39–117)
BUN: 24 mg/dL — ABNORMAL HIGH (ref 6–23)
CALCIUM: 9.5 mg/dL (ref 8.4–10.5)
CHLORIDE: 100 meq/L (ref 96–112)
CO2: 33 meq/L — AB (ref 19–32)
CREATININE: 1.33 mg/dL (ref 0.40–1.50)
GFR: 57.57 mL/min — ABNORMAL LOW (ref >60.00)
Glucose, Bld: 119 mg/dL — ABNORMAL HIGH (ref 70–99)
Potassium: 4.1 mEq/L (ref 3.5–5.1)
SODIUM: 138 meq/L (ref 135–145)
Total Bilirubin: 0.8 mg/dL (ref 0.2–1.2)
Total Protein: 7.3 g/dL (ref 6.0–8.3)

## 2017-03-26 LAB — PSA: PSA: 0.17 ng/mL (ref 0.10–4.00)

## 2017-03-26 LAB — CBC
HEMATOCRIT: 37.6 % — AB (ref 38.5–50.0)
Hemoglobin: 12.8 g/dL — ABNORMAL LOW (ref 13.2–17.1)
MCH: 29.5 pg (ref 27.0–33.0)
MCHC: 34 g/dL (ref 32.0–36.0)
MCV: 86.6 fL (ref 80.0–100.0)
MPV: 9.8 fL (ref 7.5–12.5)
Platelets: 414 10*3/uL — ABNORMAL HIGH (ref 140–400)
RBC: 4.34 10*6/uL (ref 4.20–5.80)
RDW: 13.2 % (ref 11.0–15.0)
WBC: 7.4 10*3/uL (ref 3.8–10.8)

## 2017-03-26 LAB — URINALYSIS, ROUTINE W REFLEX MICROSCOPIC
Bilirubin Urine: NEGATIVE
HGB URINE DIPSTICK: NEGATIVE
Ketones, ur: NEGATIVE
Leukocytes, UA: NEGATIVE
Nitrite: NEGATIVE
PH: 6 (ref 5.0–8.0)
RBC / HPF: NONE SEEN (ref 0–?)
SPECIFIC GRAVITY, URINE: 1.02 (ref 1.000–1.030)
TOTAL PROTEIN, URINE-UPE24: NEGATIVE
URINE GLUCOSE: NEGATIVE
UROBILINOGEN UA: 0.2 (ref 0.0–1.0)

## 2017-03-26 LAB — LIPID PANEL
CHOL/HDL RATIO: 5
Cholesterol: 176 mg/dL (ref 0–200)
HDL: 37.3 mg/dL — AB (ref >39.00)
LDL CALC: 121 mg/dL — AB (ref 0–99)
NonHDL: 138.61
TRIGLYCERIDES: 90 mg/dL (ref 0.0–149.0)
VLDL: 18 mg/dL (ref 0.0–40.0)

## 2017-03-26 LAB — TSH: TSH: 0.57 u[IU]/mL (ref 0.35–4.50)

## 2017-03-26 LAB — B12 AND FOLATE PANEL: FOLATE: 10.4 ng/mL (ref >5.9)

## 2017-03-28 LAB — HIV ANTIBODY (ROUTINE TESTING W REFLEX): HIV: NONREACTIVE

## 2017-03-28 LAB — IRON,TIBC AND FERRITIN PANEL
%SAT: 30 % (calc) (ref 15–60)
FERRITIN: 95 ng/mL (ref 20–380)
Iron: 95 ug/dL (ref 50–180)
TIBC: 318 mcg/dL (calc) (ref 250–425)

## 2017-03-30 ENCOUNTER — Telehealth: Payer: Self-pay | Admitting: Family Medicine

## 2017-03-30 ENCOUNTER — Other Ambulatory Visit: Payer: Self-pay

## 2017-03-30 DIAGNOSIS — E7800 Pure hypercholesterolemia, unspecified: Secondary | ICD-10-CM

## 2017-03-30 NOTE — Telephone Encounter (Signed)
Pt. returned call to receive results; see result notes of 03/30/17.

## 2017-03-30 NOTE — Telephone Encounter (Signed)
Copied from [place] [phone]. Topic: Quick Communication - See Telephone Encounter >> Mar 30, 2017 12:16 PM Vernona Rieger wrote: CRM for notification. See Telephone encounter for:   03/30/17.   Pt missed a call from office for labs. [facility] line busy, he is on lunch now and can answer. [phone]

## 2017-03-30 NOTE — Telephone Encounter (Signed)
Attempted to call pt back to give lab results. No answer at this time.

## 2017-09-27 ENCOUNTER — Other Ambulatory Visit (INDEPENDENT_AMBULATORY_CARE_PROVIDER_SITE_OTHER): Payer: BLUE CROSS/BLUE SHIELD

## 2017-09-27 DIAGNOSIS — E7800 Pure hypercholesterolemia, unspecified: Secondary | ICD-10-CM | POA: Diagnosis not present

## 2017-09-27 LAB — LIPID PANEL
CHOL/HDL RATIO: 4
Cholesterol: 154 mg/dL (ref 0–200)
HDL: 37.9 mg/dL — AB (ref >39.00)
LDL CALC: 91 mg/dL (ref 0–99)
NonHDL: 115.73
Triglycerides: 125 mg/dL (ref 0.0–149.0)
VLDL: 25 mg/dL (ref 0.0–40.0)

## 2017-10-05 ENCOUNTER — Other Ambulatory Visit: Payer: Self-pay | Admitting: Family Medicine

## 2017-10-05 DIAGNOSIS — F325 Major depressive disorder, single episode, in full remission: Secondary | ICD-10-CM

## 2017-11-14 DIAGNOSIS — X58XXXA Exposure to other specified factors, initial encounter: Secondary | ICD-10-CM | POA: Diagnosis not present

## 2017-11-14 DIAGNOSIS — Y999 Unspecified external cause status: Secondary | ICD-10-CM | POA: Diagnosis not present

## 2017-11-14 DIAGNOSIS — S91301A Unspecified open wound, right foot, initial encounter: Secondary | ICD-10-CM | POA: Diagnosis not present

## 2017-11-16 DIAGNOSIS — M79671 Pain in right foot: Secondary | ICD-10-CM | POA: Diagnosis not present

## 2017-11-16 DIAGNOSIS — L02611 Cutaneous abscess of right foot: Secondary | ICD-10-CM | POA: Diagnosis not present

## 2017-11-16 DIAGNOSIS — B353 Tinea pedis: Secondary | ICD-10-CM | POA: Diagnosis not present

## 2017-12-02 DIAGNOSIS — L02611 Cutaneous abscess of right foot: Secondary | ICD-10-CM | POA: Diagnosis not present

## 2017-12-02 DIAGNOSIS — B353 Tinea pedis: Secondary | ICD-10-CM | POA: Diagnosis not present

## 2018-01-24 DIAGNOSIS — B370 Candidal stomatitis: Secondary | ICD-10-CM | POA: Diagnosis not present

## 2018-02-04 ENCOUNTER — Other Ambulatory Visit: Payer: Self-pay | Admitting: Family Medicine

## 2018-02-04 DIAGNOSIS — F325 Major depressive disorder, single episode, in full remission: Secondary | ICD-10-CM

## 2018-02-07 ENCOUNTER — Encounter: Payer: Self-pay | Admitting: Family Medicine

## 2018-02-07 ENCOUNTER — Ambulatory Visit: Payer: BLUE CROSS/BLUE SHIELD | Admitting: Family Medicine

## 2018-02-07 VITALS — BP 126/78 | HR 98 | Ht 68.0 in | Wt 162.5 lb

## 2018-02-07 DIAGNOSIS — R432 Parageusia: Secondary | ICD-10-CM | POA: Diagnosis not present

## 2018-02-07 DIAGNOSIS — K140 Glossitis: Secondary | ICD-10-CM | POA: Diagnosis not present

## 2018-02-07 MED ORDER — MAGIC MOUTHWASH: 5.0000 mL | Freq: Three times a day (TID) | ORAL | 1 refills | Status: DC

## 2018-02-07 NOTE — Patient Instructions (Signed)
Glossitis  Glossitis is inflammation of the tongue. This may be a stand-alone condition, or it may be a symptom of a different condition that you have. Generally, glossitis goes away when its cause is identified and treated. Glossitis can be dangerous if it causes difficulty breathing.  What are the causes?  This condition may be caused by many different things. Common causes include:   Viral, bacterial, or yeast infections.   Allergies.   Disorders that affect the skin and mucous membranes, such as certain autoimmune disorders.   Abnormal tissue growths (tumors).   Lack of healthy red blood cells (anemia).   Movement of stomach acid into the muscular tube that connects the mouth and the stomach (gastroesophageal reflux).   Lack of proper nutrition or certain vitamins.   Certain lifelong (chronic) medical conditions, such as diabetes.  Sometimes, glossitis may not be caused by an underlying condition. In these cases, glossitis may be caused by:   Use of tobacco products, such as cigarettes, chewing tobacco, or e-cigarettes.   Excessive alcohol use.   Tongue injury or irritation.   Certain medicines, such as medicines to treat cancer.  In some cases, the cause is not known.  What increases the risk?  The following factors may make you more likely to develop this condition:   Being a man.   Taking antibiotics or steroids, such as asthma medicines.   Drinking alcohol excessively.   Using tobacco products, such as cigarettes, chewing tobacco, or e-cigarettes.   Having a chronic medical condition, such as an immune disease or cancer.   Not brushing or flossing your teeth regularly.   Being anemic or not getting proper nutrition.  What are the signs or symptoms?  Symptoms of this condition vary depending on the cause. Symptoms may include:   Swelling of the tongue.   Pain and tenderness in the tongue. Sometimes, this condition is painless.   Changes in tongue color. The tongue may be pale or bright  red.   Smooth areas on the tongue's surface.   A small mass of tissue (node) or white patch on the tongue.   Difficulty chewing, swallowing, or talking.   Difficulty breathing.  How is this diagnosed?  This condition is diagnosed based on a physical exam and medical history. Your health care provider may ask you about your eating and drinking habits. You may also have tests, including:   Blood tests.   Removal of a small amount of cells from the tongue that are examined under a microscope (biopsy).  You may be given the name of a dentist or a health care provider who specializes in ear, nose, and throat (ENT) problems (otolaryngologist).  How is this treated?  Treatment for this condition depends on the cause and may include:   Following instructions from your health care provider about keeping your mouth clean and avoiding irritants that may have caused your condition or made it worse.   Nutritional therapy. Your health care provider may tell you to change your eating and drinking habits or take a nutritional supplement.   Managing underlying conditions that may have caused your glossitis.   Medicines, such as:  ? Corticosteroids to reduce inflammation.  ? Antibiotics if your condition was caused by an infection.  ? Medicines that numb your tongue or mouth (local anesthetics).  Follow these instructions at home:  Eating and drinking     Eat healthy foods. Follow instructions from your health care provider about eating or drinking restrictions.     If you drink alcohol, limit how much you have:  ? 0-1 drink a day for women.  ? 0-2 drinks a day for men.   Be aware of how much alcohol is in your drink. In the [place] one drink equals one 12 oz bottle of beer (355 mL), one 5 oz glass of wine (148 mL), or one 1 oz glass of hard liquor (44 mL).  Mouth care     Keep your teeth and mouth clean. This includes brushing and flossing frequently and having regular dental checkups.   If you wear dentures or dental  braces, work with your dentist to make sure they fit correctly.   Follow other instructions from your health care provider about how to take care of your mouth. He or she may recommend that you:  ? Gently brush your tongue.  ? Gargle with a salt-water mixture 3-4 times a day or as needed. To make a salt-water mixture, completely dissolve -1 tsp of salt in 1 cup of warm water.  ? Avoid any irritants that may have caused your condition or made it worse, such as chemicals or certain foods.  ? Avoid breath mints, antibacterial mouthwash, and chewing gum.  General instructions     Do not use any products that contain nicotine or tobacco, such as cigarettes and e-cigarettes. If you need help quitting, ask your health care provider.   Take over-the-counter and prescription medicines only as told by your health care provider.   Take supplements only as told by your health care provider. Follow the directions carefully.   Keep all follow-up visits as told by your health care provider. This is important.  Contact a health care provider if:   You have a fever.   You develop new symptoms.   You have symptoms that do not get better with medicine or get worse.   You have symptoms that do not go away after 10 days.   You cannot eat or drink because of your pain.  Get help right away if:   You have severe pain or swelling.   You have difficulty breathing, swallowing, or talking.  Summary   Glossitis is inflammation of the tongue. It can be caused by many different things.   Glossitis generally goes away when its cause is identified and treated.   Keep your teeth and mouth clean. This includes brushing and flossing frequently and having regular dental checkups.   Eat healthy foods. Follow instructions from your health care provider about eating or drinking restrictions.  This information is not intended to replace advice given to you by your health care provider. Make sure you discuss any questions you have with  your health care provider.  Document Released: 01/23/2002 Document Revised: 05/31/2017 Document Reviewed: 05/31/2017  Elsevier Interactive Patient Education  2019 [employer]

## 2018-02-07 NOTE — Progress Notes (Signed)
Established Patient Office Visit  Subjective:  Patient ID: Luke Odonnell, male    DOB: 1955/08/03  Age: 62 y.o. MRN: [id number]  CC:  Chief Complaint  Patient presents with  . blisters on tongue    HPI Luke Odonnell presents for evaluation of a decrease in taste sensation and irritation in the outer rim of his tongue status post taking Diflucan 150 mg 7 days for an oral yeast infection.  Diflucan seem to help the yeast infection but his tongue feels irritated.  There is no fever chills postnasal drip or cough.  He does feel mucus in the back of his throat at times.  Sense of smell is intact.  History reviewed. No pertinent past medical history.  History reviewed. No pertinent surgical history.  History reviewed. No pertinent family history.  Social History   Socioeconomic History  . Marital status: Married    Spouse name: Not on file  . Number of children: Not on file  . Years of education: Not on file  . Highest education level: Not on file  Occupational History  . Not on file  Social Needs  . Financial resource strain: Not on file  . Food insecurity:    Worry: Not on file    Inability: Not on file  . Transportation needs:    Medical: Not on file    Non-medical: Not on file  Tobacco Use  . Smoking status: Never Smoker  . Smokeless tobacco: Never Used  Substance and Sexual Activity  . Alcohol use: Not on file  . Drug use: Not on file  . Sexual activity: Not on file  Lifestyle  . Physical activity:    Days per week: Not on file    Minutes per session: Not on file  . Stress: Not on file  Relationships  . Social connections:    Talks on phone: Not on file    Gets together: Not on file    Attends religious service: Not on file    Active member of club or organization: Not on file    Attends meetings of clubs or organizations: Not on file    Relationship status: Not on file  . Intimate partner violence:    Fear of current or ex partner: Not on file    Emotionally  abused: Not on file    Physically abused: Not on file    Forced sexual activity: Not on file  Other Topics Concern  . Not on file  Social History Narrative  . Not on file    Outpatient Medications Prior to Visit  Medication Sig Dispense Refill  . aspirin EC 81 MG tablet Take 81 mg by mouth daily.    . fluticasone (FLONASE) 50 MCG/ACT nasal spray Place 2 sprays into both nostrils daily. 16 g 6  . PARoxetine (PAXIL) 20 MG tablet TAKE 1 TABLET BY MOUTH EVERY DAY 90 tablet 0  . valsartan-hydrochlorothiazide (DIOVAN-HCT) 160-25 MG tablet Take 1 tablet by mouth daily. 90 tablet 1  . azithromycin (ZITHROMAX) 250 MG tablet Take two pills today and then one each day until finished. 6 tablet 0   No facility-administered medications prior to visit.     Allergies  Allergen Reactions  . Neurontin [Gabapentin] Swelling    Dropped BP, in hospital for 3 days.    ROS Review of Systems  Constitutional: Negative for chills, diaphoresis, fatigue, fever and unexpected weight change.  HENT: Negative for congestion, dental problem, ear pain, rhinorrhea, sinus pressure and sinus  pain.   Eyes: Negative for photophobia and visual disturbance.  Respiratory: Negative.   Cardiovascular: Negative.   Gastrointestinal: Negative.   Skin: Negative.   Psychiatric/Behavioral: Negative.       Objective:    Physical Exam  Constitutional: He is oriented to person, place, and time. He appears well-developed and well-nourished. No distress.  HENT:  Head: Normocephalic and atraumatic.  Right Ear: External ear normal.  Left Ear: External ear normal.  Mouth/Throat: Oropharynx is clear and moist. No oropharyngeal exudate.    Eyes: Pupils are equal, round, and reactive to light. Conjunctivae are normal. Right eye exhibits no discharge. Left eye exhibits no discharge. No scleral icterus.  Neck: Neck supple. No JVD present. No tracheal deviation present. No thyromegaly present.  Cardiovascular: Normal rate,  regular rhythm and normal heart sounds.  Pulmonary/Chest: Effort normal and breath sounds normal.  Neurological: He is alert and oriented to person, place, and time.  Dysgeusia?  Skin: Skin is warm and dry. He is not diaphoretic.  Psychiatric: He has a normal mood and affect. His behavior is normal.    BP 126/78   Pulse 98   Ht 5\' 8"  (1.727 m)   Wt 162 lb 8 oz (73.7 kg)   SpO2 95%   BMI 24.71 kg/m  Wt Readings from Last 3 Encounters:  02/07/18 162 lb 8 oz (73.7 kg)  03/18/17 165 lb (74.8 kg)   BP Readings from Last 3 Encounters:  02/07/18 126/78  03/18/17 138/90  02/08/13 (!) 147/82   Guideline developer:  UpToDate (see UpToDate for funding source) Date Released: June 2014  Health Maintenance Due  Topic Date Due  . INFLUENZA VACCINE  09/16/2017    There are no preventive care reminders to display for this patient.  Lab Results  Component Value Date   TSH 0.57 03/26/2017   Lab Results  Component Value Date   WBC 7.4 03/26/2017   HGB 12.8 (L) 03/26/2017   HCT 37.6 (L) 03/26/2017   MCV 86.6 03/26/2017   PLT 414 (H) 03/26/2017   Lab Results  Component Value Date   NA 138 03/26/2017   K 4.1 03/26/2017   CO2 33 (H) 03/26/2017   GLUCOSE 119 (H) 03/26/2017   BUN 24 (H) 03/26/2017   CREATININE 1.33 03/26/2017   BILITOT 0.8 03/26/2017   ALKPHOS 91 03/26/2017   AST 17 03/26/2017   ALT 16 03/26/2017   PROT 7.3 03/26/2017   ALBUMIN 4.2 03/26/2017   CALCIUM 9.5 03/26/2017   GFR 57.57 (L) 03/26/2017   Lab Results  Component Value Date   CHOL 154 09/27/2017   Lab Results  Component Value Date   HDL 37.90 (L) 09/27/2017   Lab Results  Component Value Date   LDLCALC 91 09/27/2017   Lab Results  Component Value Date   TRIG 125.0 09/27/2017   Lab Results  Component Value Date   CHOLHDL 4 09/27/2017   No results found for: HGBA1C    Assessment & Plan:   Problem List Items Addressed This Visit      Digestive   Glossitis - Primary   Relevant  Medications   magic mouthwash SOLN     Other   Dysgeusia      Meds ordered this encounter  Medications  . magic mouthwash SOLN    Sig: Take 5 mLs by mouth 3 (three) times daily. Swish, gargle and expectorate.    Dispense:  150 mL    Refill:  1    Equal parts prednisolone,  benadryl, maalox, and nystatin.    Follow-up: Return ENT referral if not improving. .   Question medicine side effect.  Hopefully patient will improve with Magic mouthwash as prescribed.  Consider neurology versus ENT referral as needed.

## 2018-03-13 ENCOUNTER — Other Ambulatory Visit: Payer: Self-pay | Admitting: Family Medicine

## 2018-03-13 DIAGNOSIS — I10 Essential (primary) hypertension: Secondary | ICD-10-CM

## 2018-03-17 ENCOUNTER — Telehealth: Payer: Self-pay

## 2018-03-17 DIAGNOSIS — K140 Glossitis: Secondary | ICD-10-CM

## 2018-03-17 NOTE — Telephone Encounter (Signed)
Referral entered, they will contact patient to schedule appointment.

## 2018-03-17 NOTE — Telephone Encounter (Signed)
Patient is still not improving, per last note, referral to ENT if not better.  Okay for referral?     Copied from [place] [phone]. Topic: Referral - Request for Referral >> Mar 17, 2018  9:07 AM Lennox Solders wrote: Has patient seen PCP for this complaint? Yes pt saw dr Ethelene Hal on 02/07/18. Pt needs a referral to ENT for blister on tongue. Pt has [facility]. Ok to leave message on cell

## 2018-03-17 NOTE — Telephone Encounter (Signed)
Yes

## 2018-03-17 NOTE — Addendum Note (Signed)
Addended by: Kateri Mc E on: 03/17/2018 11:12 AM   Modules accepted: Orders

## 2018-03-22 ENCOUNTER — Telehealth: Payer: Self-pay | Admitting: Family Medicine

## 2018-03-22 DIAGNOSIS — K140 Glossitis: Secondary | ICD-10-CM

## 2018-03-22 MED ORDER — MAGIC MOUTHWASH: 5.0000 mL | Freq: Three times a day (TID) | ORAL | 1 refills | Status: DC

## 2018-03-22 NOTE — Telephone Encounter (Signed)
Copied from [place] [phone]. Topic: Quick Communication - Rx Refill/Question >> Mar 22, 2018  9:14 AM Antonieta Iba C wrote: Medication: magic mouthwash SOLN - pt would like to get a refill until he is able to get an apt with [facility].   Has the patient contacted their pharmacy?  No  (Agent: If no, request that the patient contact the pharmacy for the refill.) (Agent: If yes, when and what did the pharmacy advise?)  Preferred Pharmacy (with phone number or street name): [id number] - [place], [place] - [address], [phone] (Phone) [phone] (Fax)    Agent: Please be advised that RX refills may take up to 3 business days. We ask that you follow-up with your pharmacy.

## 2018-03-22 NOTE — Telephone Encounter (Signed)
Yes

## 2018-03-22 NOTE — Telephone Encounter (Signed)
Rx printed to be signed by MD & then will be faxed to pharmacy.

## 2018-03-22 NOTE — Telephone Encounter (Signed)
Rx signed & faxed to patient's pharmacy. I left patient a voicemail letting him know the Rx has been sent.

## 2018-03-22 NOTE — Telephone Encounter (Signed)
Referral has been placed & info has been faxed over to the ENT office. Okay to refill magic mouthwash to hold patient until he sees ENT?

## 2018-04-05 ENCOUNTER — Ambulatory Visit (INDEPENDENT_AMBULATORY_CARE_PROVIDER_SITE_OTHER): Payer: BLUE CROSS/BLUE SHIELD | Admitting: Family Medicine

## 2018-04-05 ENCOUNTER — Encounter: Payer: Self-pay | Admitting: Family Medicine

## 2018-04-05 VITALS — BP 148/80 | HR 87 | Ht 68.0 in | Wt 165.0 lb

## 2018-04-05 DIAGNOSIS — K140 Glossitis: Secondary | ICD-10-CM

## 2018-04-05 DIAGNOSIS — Z0000 Encounter for general adult medical examination without abnormal findings: Secondary | ICD-10-CM

## 2018-04-05 DIAGNOSIS — E519 Thiamine deficiency, unspecified: Secondary | ICD-10-CM

## 2018-04-05 DIAGNOSIS — F339 Major depressive disorder, recurrent, unspecified: Secondary | ICD-10-CM | POA: Diagnosis not present

## 2018-04-05 DIAGNOSIS — F325 Major depressive disorder, single episode, in full remission: Secondary | ICD-10-CM

## 2018-04-05 DIAGNOSIS — I10 Essential (primary) hypertension: Secondary | ICD-10-CM | POA: Diagnosis not present

## 2018-04-05 MED ORDER — FLUCONAZOLE 100 MG PO TABS: 100.0000 mg | ORAL_TABLET | Freq: Every day | ORAL | 0 refills | Status: DC

## 2018-04-05 MED ORDER — VALSARTAN-HYDROCHLOROTHIAZIDE 160-25 MG PO TABS: 1.0000 | ORAL_TABLET | Freq: Every day | ORAL | 2 refills | Status: DC

## 2018-04-05 MED ORDER — PAROXETINE HCL 20 MG PO TABS: 20.0000 mg | ORAL_TABLET | Freq: Every day | ORAL | 2 refills | Status: DC

## 2018-04-05 NOTE — Progress Notes (Addendum)
Established Patient Office Visit  Subjective:  Patient ID: Luke Odonnell, male    DOB: 03/05/1955  Age: 63 y.o. MRN: [id number]  CC: No chief complaint on file.   HPI Luke Odonnell presents for hypertension and depression follow-up.  Paxil is worked well for him.  Most noticeably is not at the crying spells he had experienced before starting the drug.  Diovan has been controlling his blood pressure.  His tongue remains irritated and swollen over the last month or so.  There is fissuring and decreased taste.  He has not had a rash inside his mouth for on the sides of his mouth.  Smell remains intact.  There is been no weight loss or night sweats.  He has not noticed any digestive issues.  Urine flow has been good.  He has an appointment on the 25th of ENT. History reviewed. No pertinent past medical history.  History reviewed. No pertinent surgical history.  History reviewed. No pertinent family history.  Social History   Socioeconomic History  . Marital status: Married    Spouse name: Not on file  . Number of children: Not on file  . Years of education: Not on file  . Highest education level: Not on file  Occupational History  . Not on file  Social Needs  . Financial resource strain: Not on file  . Food insecurity:    Worry: Not on file    Inability: Not on file  . Transportation needs:    Medical: Not on file    Non-medical: Not on file  Tobacco Use  . Smoking status: Never Smoker  . Smokeless tobacco: Never Used  Substance and Sexual Activity  . Alcohol use: Not on file  . Drug use: Not on file  . Sexual activity: Not on file  Lifestyle  . Physical activity:    Days per week: Not on file    Minutes per session: Not on file  . Stress: Not on file  Relationships  . Social connections:    Talks on phone: Not on file    Gets together: Not on file    Attends religious service: Not on file    Active member of club or organization: Not on file    Attends meetings of  clubs or organizations: Not on file    Relationship status: Not on file  . Intimate partner violence:    Fear of current or ex partner: Not on file    Emotionally abused: Not on file    Physically abused: Not on file    Forced sexual activity: Not on file  Other Topics Concern  . Not on file  Social History Narrative  . Not on file    Outpatient Medications Prior to Visit  Medication Sig Dispense Refill  . aspirin EC 81 MG tablet Take 81 mg by mouth daily.    . fluticasone (FLONASE) 50 MCG/ACT nasal spray Place 2 sprays into both nostrils daily. 16 g 6  . magic mouthwash SOLN Take 5 mLs by mouth 3 (three) times daily. Swish, gargle and expectorate. 150 mL 1  . PARoxetine (PAXIL) 20 MG tablet TAKE 1 TABLET BY MOUTH EVERY DAY 90 tablet 0  . valsartan-hydrochlorothiazide (DIOVAN-HCT) 160-25 MG tablet TAKE 1 TABLET BY MOUTH EVERY DAY 90 tablet 1   No facility-administered medications prior to visit.     Allergies  Allergen Reactions  . Neurontin [Gabapentin] Swelling    Dropped BP, in hospital for 3 days.  ROS Review of Systems  Constitutional: Negative for diaphoresis, fatigue, fever and unexpected weight change.  HENT: Negative for congestion, postnasal drip, rhinorrhea, sinus pressure and sinus pain.   Eyes: Negative for photophobia and visual disturbance.  Respiratory: Negative.   Cardiovascular: Negative.   Gastrointestinal: Negative.   Endocrine: Negative for polyphagia and polyuria.  Genitourinary: Negative for difficulty urinating, frequency and urgency.  Musculoskeletal: Positive for back pain.  Skin: Negative for pallor and rash.  Neurological: Negative.   Hematological: Does not bruise/bleed easily.      Objective:    Physical Exam  Constitutional: He is oriented to person, place, and time. He appears well-developed and well-nourished. No distress.  HENT:  Head: Normocephalic and atraumatic.  Right Ear: External ear normal.  Left Ear: External ear  normal.  Mouth/Throat: Oropharynx is clear and moist. No oropharyngeal exudate.    Eyes: Pupils are equal, round, and reactive to light. Conjunctivae are normal. Right eye exhibits no discharge. Left eye exhibits no discharge. No scleral icterus.  Neck: Neck supple. No JVD present. No tracheal deviation present. No thyromegaly present.  Cardiovascular: Normal rate, regular rhythm and normal heart sounds.  Pulmonary/Chest: Effort normal and breath sounds normal. No stridor.  Abdominal: Bowel sounds are normal.  Lymphadenopathy:    He has no cervical adenopathy.  Neurological: He is alert and oriented to person, place, and time.  Skin: Skin is warm and dry. He is not diaphoretic.  Psychiatric: He has a normal mood and affect. His behavior is normal.    BP (!) 148/80   Pulse 87   Ht 5\' 8"  (1.727 m)   Wt 165 lb (74.8 kg)   SpO2 96%   BMI 25.09 kg/m  Wt Readings from Last 3 Encounters:  04/05/18 165 lb (74.8 kg)  02/07/18 162 lb 8 oz (73.7 kg)  03/18/17 165 lb (74.8 kg)   BP Readings from Last 3 Encounters:  04/05/18 (!) 148/80  02/07/18 126/78  03/18/17 138/90   Guideline developer:  UpToDate (see UpToDate for funding source) Date Released: June 2014  Health Maintenance Due  Topic Date Due  . INFLUENZA VACCINE  09/16/2017    There are no preventive care reminders to display for this patient.  Lab Results  Component Value Date   TSH 0.57 03/26/2017   Lab Results  Component Value Date   WBC 7.0 04/05/2018   HGB 12.2 (L) 04/05/2018   HCT 35.9 (L) 04/05/2018   MCV 88.9 04/05/2018   PLT 377.0 04/05/2018   Lab Results  Component Value Date   NA 140 04/05/2018   K 4.4 04/05/2018   CO2 33 (H) 04/05/2018   GLUCOSE 88 04/05/2018   BUN 19 04/05/2018   CREATININE 1.10 04/05/2018   BILITOT 0.5 04/05/2018   ALKPHOS 79 04/05/2018   AST 22 04/05/2018   ALT 16 04/05/2018   PROT 7.5 04/05/2018   ALBUMIN 4.4 04/05/2018   CALCIUM 9.7 04/05/2018   GFR 67.21 04/05/2018    Lab Results  Component Value Date   CHOL 154 09/27/2017   Lab Results  Component Value Date   HDL 37.90 (L) 09/27/2017   Lab Results  Component Value Date   LDLCALC 91 09/27/2017   Lab Results  Component Value Date   TRIG 125.0 09/27/2017   Lab Results  Component Value Date   CHOLHDL 4 09/27/2017   No results found for: HGBA1C    Assessment & Plan:   Problem List Items Addressed This Visit  Cardiovascular and Mediastinum   Essential hypertension - Primary   Relevant Medications   valsartan-hydrochlorothiazide (DIOVAN-HCT) 160-25 MG tablet   Other Relevant Orders   Comprehensive metabolic panel (Completed)     Digestive   Glossitis   Relevant Medications   fluconazole (DIFLUCAN) 100 MG tablet   Other Relevant Orders   Vitamin B1 (Completed)   Iron, TIBC and Ferritin Panel (Completed)   B12 and Folate Panel (Completed)   CBC (Completed)     Other   Depression, recurrent (HCC)   Relevant Medications   PARoxetine (PAXIL) 20 MG tablet    Other Visit Diagnoses    Healthcare maintenance       Relevant Orders   PSA (Completed)   Depression, major, single episode, complete remission (HCC)       Relevant Medications   PARoxetine (PAXIL) 20 MG tablet   Vitamin B1 deficiency       Relevant Medications   thiamine (VITAMIN B-1) 100 MG tablet      Meds ordered this encounter  Medications  . fluconazole (DIFLUCAN) 100 MG tablet    Sig: Take 1 tablet (100 mg total) by mouth daily.    Dispense:  14 tablet    Refill:  0  . PARoxetine (PAXIL) 20 MG tablet    Sig: Take 1 tablet (20 mg total) by mouth daily.    Dispense:  90 tablet    Refill:  2  . valsartan-hydrochlorothiazide (DIOVAN-HCT) 160-25 MG tablet    Sig: Take 1 tablet by mouth daily.    Dispense:  90 tablet    Refill:  2  . thiamine (VITAMIN B-1) 100 MG tablet    Sig: Take 1 tablet (100 mg total) by mouth daily.    Dispense:  90 tablet    Refill:  0    Follow-up: No follow-ups on file.     Did note that Paxil can be associated with xerostomia.  He has taken this drug for some time now and has not noticed a dry mouth with it.  It is a consideration.  Go ahead and treat cold oral candidiasis.  Checking multiple vitamin, iron levels and a blood count.  ENT consult on the 25th.

## 2018-04-06 LAB — COMPREHENSIVE METABOLIC PANEL
ALT: 16 U/L (ref 0–53)
AST: 22 U/L (ref 0–37)
Albumin: 4.4 g/dL (ref 3.5–5.2)
Alkaline Phosphatase: 79 U/L (ref 39–117)
BUN: 19 mg/dL (ref 6–23)
CO2: 33 mEq/L — ABNORMAL HIGH (ref 19–32)
Calcium: 9.7 mg/dL (ref 8.4–10.5)
Chloride: 100 mEq/L (ref 96–112)
Creatinine, Ser: 1.1 mg/dL (ref 0.40–1.50)
GFR: 67.21 mL/min (ref >60.00)
Glucose, Bld: 88 mg/dL (ref 70–99)
POTASSIUM: 4.4 meq/L (ref 3.5–5.1)
Sodium: 140 mEq/L (ref 135–145)
TOTAL PROTEIN: 7.5 g/dL (ref 6.0–8.3)
Total Bilirubin: 0.5 mg/dL (ref 0.2–1.2)

## 2018-04-06 LAB — CBC
HEMATOCRIT: 35.9 % — AB (ref 39.0–52.0)
HEMOGLOBIN: 12.2 g/dL — AB (ref 13.0–17.0)
MCHC: 33.9 g/dL (ref 30.0–36.0)
MCV: 88.9 fl (ref 78.0–100.0)
Platelets: 377 10*3/uL (ref 150.0–400.0)
RBC: 4.04 Mil/uL — ABNORMAL LOW (ref 4.22–5.81)
RDW: 13.9 % (ref 11.5–15.5)
WBC: 7 10*3/uL (ref 4.0–10.5)

## 2018-04-06 LAB — PSA: PSA: 0.22 ng/mL (ref 0.10–4.00)

## 2018-04-06 LAB — B12 AND FOLATE PANEL
FOLATE: 17.6 ng/mL (ref >5.9)
Vitamin B-12: >1525 pg/mL — ABNORMAL HIGH (ref 211–911)

## 2018-04-08 ENCOUNTER — Encounter: Payer: Self-pay | Admitting: Family Medicine

## 2018-04-08 ENCOUNTER — Telehealth: Payer: Self-pay | Admitting: Family Medicine

## 2018-04-08 LAB — IRON,TIBC AND FERRITIN PANEL
%SAT: 17 % (calc) — ABNORMAL LOW (ref 20–48)
Ferritin: 105 ng/mL (ref 24–380)
Iron: 56 ug/dL (ref 50–180)
TIBC: 330 mcg/dL (calc) (ref 250–425)

## 2018-04-08 LAB — VITAMIN B1: Vitamin B1 (Thiamine): 7 nmol/L — ABNORMAL LOW (ref 8–30)

## 2018-04-08 MED ORDER — VITAMIN B-1 100 MG PO TABS: 100.0000 mg | ORAL_TABLET | Freq: Every day | ORAL | 0 refills | Status: DC

## 2018-04-08 NOTE — Telephone Encounter (Signed)
Copied from [place] [phone]. Topic: Quick Communication - Lab Results (Clinic Use ONLY) >> Apr 08, 2018 12:43 PM Rodrigo Ran, CMA wrote: Called patient to inform them of his lab results. When patient returns call, triage nurse may disclose results.

## 2018-04-08 NOTE — Addendum Note (Signed)
Addended by: Jon Billings on: 04/08/2018 11:32 AM   Modules accepted: Orders

## 2018-04-08 NOTE — Telephone Encounter (Signed)
Charted in result notes. 

## 2018-04-12 ENCOUNTER — Telehealth: Payer: Self-pay

## 2018-04-12 DIAGNOSIS — K140 Glossitis: Secondary | ICD-10-CM | POA: Diagnosis not present

## 2018-04-12 NOTE — Telephone Encounter (Signed)
[facility] called to report they did not receive pt.'s Vit B 12 order. Given verbal order that Dr. Ethelene Hal sent on 04/08/18.

## 2018-05-02 ENCOUNTER — Other Ambulatory Visit: Payer: Self-pay | Admitting: Family Medicine

## 2018-05-02 DIAGNOSIS — K140 Glossitis: Secondary | ICD-10-CM

## 2018-06-10 ENCOUNTER — Telehealth: Payer: Self-pay | Admitting: Family Medicine

## 2018-06-10 NOTE — Telephone Encounter (Signed)
Called patient on behalf of Dr Ethelene Hal to make sure he was aware that we are offering virtual visit with covid 19 going on, left message

## 2018-06-21 ENCOUNTER — Telehealth: Payer: Self-pay | Admitting: Family Medicine

## 2018-06-21 NOTE — Telephone Encounter (Signed)
Patient called and wanted to set up appointment because the rash in his mouth is coming back, Patient doesn't have video access so I wasn't sure if Dr. Ethelene Hal would want to just do a televisit or in person

## 2018-06-21 NOTE — Telephone Encounter (Signed)
It's okay to schedule an in-person visit for Thursday, thanks!

## 2018-06-21 NOTE — Telephone Encounter (Signed)
Called and left message.

## 2018-06-21 NOTE — Telephone Encounter (Signed)
Yes

## 2018-06-24 ENCOUNTER — Ambulatory Visit (INDEPENDENT_AMBULATORY_CARE_PROVIDER_SITE_OTHER): Payer: BLUE CROSS/BLUE SHIELD | Admitting: Family Medicine

## 2018-06-24 ENCOUNTER — Encounter: Payer: Self-pay | Admitting: Family Medicine

## 2018-06-24 VITALS — Ht 68.0 in

## 2018-06-24 DIAGNOSIS — D649 Anemia, unspecified: Secondary | ICD-10-CM | POA: Diagnosis not present

## 2018-06-24 DIAGNOSIS — F339 Major depressive disorder, recurrent, unspecified: Secondary | ICD-10-CM

## 2018-06-24 DIAGNOSIS — K140 Glossitis: Secondary | ICD-10-CM

## 2018-06-24 DIAGNOSIS — R5383 Other fatigue: Secondary | ICD-10-CM | POA: Insufficient documentation

## 2018-06-24 DIAGNOSIS — R6882 Decreased libido: Secondary | ICD-10-CM | POA: Diagnosis not present

## 2018-06-24 DIAGNOSIS — E519 Thiamine deficiency, unspecified: Secondary | ICD-10-CM | POA: Diagnosis not present

## 2018-06-24 DIAGNOSIS — E611 Iron deficiency: Secondary | ICD-10-CM | POA: Insufficient documentation

## 2018-06-24 DIAGNOSIS — R778 Other specified abnormalities of plasma proteins: Secondary | ICD-10-CM

## 2018-06-24 MED ORDER — FLUCONAZOLE 100 MG PO TABS: 100.0000 mg | ORAL_TABLET | Freq: Every day | ORAL | 0 refills | Status: DC

## 2018-06-24 NOTE — Progress Notes (Addendum)
Established Patient Office Visit  Subjective:  Patient ID: Luke Odonnell, male    DOB: November 10, 1955  Age: 63 y.o. MRN: [id number]  CC:  Chief Complaint  Patient presents with  . 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  Patient says that the irritation and white coating of his tongue responded well to the 2 courses of Diflucan but then seemed to return a few weeks later.  He tolerated the Diflucan well.  Depression is doing well with the Paxil.  Blood pressure is been in the 120s over 70s range on the Diovan.  He reports some fatigue and decreased libido.  He would like his testosterone checked.  He has been taking the thiamine and iron for supplementation.  Continues to work per normal.  History reviewed. No pertinent past medical history.  History reviewed. No pertinent surgical history.  History reviewed. No pertinent family history.  Social History   Socioeconomic History  . Marital status: Married    Spouse name: Not on file  . Number of children: Not on file  . Years of education: Not on file  . Highest education level: Not on file  Occupational History  . Not on file  Social Needs  . Financial resource strain: Not on file  . Food insecurity:    Worry: Not on file    Inability: Not on file  . Transportation needs:    Medical: Not on file    Non-medical: Not on file  Tobacco Use  . Smoking status: Never Smoker  . Smokeless tobacco: Never Used  Substance and Sexual Activity  . Alcohol use: Not Currently  . Drug use: Not Currently  . Sexual activity: Not on file  Lifestyle  . Physical activity:    Days per week: Not on file    Minutes per session: Not on file  . Stress: Not on file  Relationships  . Social connections:    Talks on phone: Not on file    Gets together: Not on file    Attends religious service: Not on file    Active member of club or organization: Not on file    Attends meetings of clubs or organizations: Not on file     Relationship status: Not on file  . Intimate partner violence:    Fear of current or ex partner: Not on file    Emotionally abused: Not on file    Physically abused: Not on file    Forced sexual activity: Not on file  Other Topics Concern  . Not on file  Social History Narrative  . Not on file    Outpatient Medications Prior to Visit  Medication Sig Dispense Refill  . aspirin EC 81 MG tablet Take 81 mg by mouth daily.    . fluconazole (DIFLUCAN) 100 MG tablet TAKE 1 TABLET BY MOUTH EVERY DAY 14 tablet 0  . fluticasone (FLONASE) 50 MCG/ACT nasal spray Place 2 sprays into both nostrils daily. 16 g 6  . magic mouthwash SOLN Take 5 mLs by mouth 3 (three) times daily. Swish, gargle and expectorate. 150 mL 1  . PARoxetine (PAXIL) 20 MG tablet Take 1 tablet (20 mg total) by mouth daily. 90 tablet 2  . thiamine (VITAMIN B-1) 100 MG tablet Take 1 tablet (100 mg total) by mouth daily. 90 tablet 0  . valsartan-hydrochlorothiazide (DIOVAN-HCT) 160-25 MG tablet Take 1 tablet by mouth daily. 90 tablet 2   No facility-administered medications prior to  visit.     Allergies  Allergen Reactions  . Neurontin [Gabapentin] Swelling    Dropped BP, in hospital for 3 days.    ROS Review of Systems  Constitutional: Negative for diaphoresis, fatigue, fever and unexpected weight change.  Respiratory: Negative.   Cardiovascular: Negative.   Gastrointestinal: Negative.   Psychiatric/Behavioral: Negative.       Objective:    Physical Exam  Constitutional: He is oriented to person, place, and time. No distress.  Pulmonary/Chest: Effort normal.  Neurological: He is alert and oriented to person, place, and time.  Psychiatric: He has a normal mood and affect. His behavior is normal.    Ht 5\' 8"  (1.727 m)   BMI 25.09 kg/m  Wt Readings from Last 3 Encounters:  04/05/18 165 lb (74.8 kg)  02/07/18 162 lb 8 oz (73.7 kg)  03/18/17 165 lb (74.8 kg)     Health Maintenance Due  Topic Date Due   . PNA vac Low Risk Adult (2 of 2 - PPSV23) 05/26/2018    There are no preventive care reminders to display for this patient.  Lab Results  Component Value Date   TSH 0.57 03/26/2017   Lab Results  Component Value Date   WBC 7.0 04/05/2018   HGB 12.2 (L) 04/05/2018   HCT 35.9 (L) 04/05/2018   MCV 88.9 04/05/2018   PLT 377.0 04/05/2018   Lab Results  Component Value Date   NA 140 04/05/2018   K 4.4 04/05/2018   CO2 33 (H) 04/05/2018   GLUCOSE 88 04/05/2018   BUN 19 04/05/2018   CREATININE 1.10 04/05/2018   BILITOT 0.5 04/05/2018   ALKPHOS 79 04/05/2018   AST 22 04/05/2018   ALT 16 04/05/2018   PROT 7.5 04/05/2018   ALBUMIN 4.4 04/05/2018   CALCIUM 9.7 04/05/2018   GFR 67.21 04/05/2018   Lab Results  Component Value Date   CHOL 154 09/27/2017   Lab Results  Component Value Date   HDL 37.90 (L) 09/27/2017   Lab Results  Component Value Date   LDLCALC 91 09/27/2017   Lab Results  Component Value Date   TRIG 125.0 09/27/2017   Lab Results  Component Value Date   CHOLHDL 4 09/27/2017   No results found for: HGBA1C    Assessment & Plan:   Problem List Items Addressed This Visit    None    Visit Diagnoses    Thiamine deficiency    -  Primary   Anemia, unspecified type          No orders of the defined types were placed in this encounter.   Follow-up: No follow-ups on file.    Libby Maw, MD Virtual Visit via Telephone Note  I connected with Luke Odonnell on 06/24/18 at  9:00 AM EDT by telephone and verified that I am speaking with the correct person using two identifiers.  Location: Patient: home Provider:    I discussed the limitations, risks, security and privacy concerns of performing an evaluation and management service by telephone and the availability of in person appointments. I also discussed with the patient that there may be a patient responsible charge related to this service. The patient expressed understanding and  agreed to proceed. Interactive video and audio telecommunications were attempted between myself and the patient. However they failed due to the patient having technical difficulties or not having access to video capability. We continued and completed with audio only.Interactive video and audio telecommunications were attempted between myself and  the patient. However they failed due to the patient having technical difficulties or not having access to video capability. We continued and completed with audio only.  History of Present Illness:    Observations/Objective:   Assessment and Plan:   Follow Up Instructions:    I discussed the assessment and treatment plan with the patient. The patient was provided an opportunity to ask questions and all were answered. The patient agreed with the plan and demonstrated an understanding of the instructions.   The patient was advised to call back or seek an in-person evaluation if the symptoms worsen or if the condition fails to improve as anticipated.  I provided 20 minutes of non-face-to-face time during this encounter.   5/26: Discussed immunofixation/spep with patient. He is taking iron bid. No visible blood in stool or urine. Wants to see me for a face to face. We will consider hematology referral and possible tongue biopsy. He will call and make an appointment.

## 2018-06-27 ENCOUNTER — Other Ambulatory Visit (INDEPENDENT_AMBULATORY_CARE_PROVIDER_SITE_OTHER): Payer: BLUE CROSS/BLUE SHIELD

## 2018-06-27 DIAGNOSIS — E519 Thiamine deficiency, unspecified: Secondary | ICD-10-CM | POA: Diagnosis not present

## 2018-06-27 DIAGNOSIS — R6882 Decreased libido: Secondary | ICD-10-CM

## 2018-06-27 DIAGNOSIS — E611 Iron deficiency: Secondary | ICD-10-CM

## 2018-06-27 DIAGNOSIS — R5383 Other fatigue: Secondary | ICD-10-CM | POA: Diagnosis not present

## 2018-06-27 DIAGNOSIS — D649 Anemia, unspecified: Secondary | ICD-10-CM | POA: Diagnosis not present

## 2018-06-27 NOTE — Progress Notes (Signed)
Patient and staff wore face masks for visit per COVID-19 protocol.   Patient took home urine cup per testing instructions in order to collect 1st morning urine. Patient was also given a ice pack to keep the sample cold on through out the day tomorrow until he could return the sample tomorrow evening after work.   -DMG

## 2018-06-27 NOTE — Addendum Note (Signed)
Addended by: Diona Foley on: 06/27/2018 10:18 AM   Modules accepted: Orders

## 2018-06-28 ENCOUNTER — Other Ambulatory Visit: Payer: BLUE CROSS/BLUE SHIELD

## 2018-06-28 DIAGNOSIS — D649 Anemia, unspecified: Secondary | ICD-10-CM | POA: Diagnosis not present

## 2018-06-28 NOTE — Progress Notes (Signed)
Patient dropping off specimen from yesterday's lab visit.

## 2018-06-30 LAB — PROTEIN ELECTROPHORESIS, URINE REFLEX
Albumin ELP, Urine: 28.1 %
Alpha-1-Globulin, U: 2.3 %
Alpha-2-Globulin, U: 17.7 %
Beta Globulin, U: 25 %
Gamma Globulin, U: 26.8 %
Protein, Ur: 10.1 mg/dL

## 2018-07-02 LAB — PROTEIN ELECTROPHORESIS, SERUM
Albumin ELP: 4.2 g/dL (ref 3.8–4.8)
Alpha 1: 0.3 g/dL (ref 0.2–0.3)
Alpha 2: 0.7 g/dL (ref 0.5–0.9)
Beta 2: 0.6 g/dL — ABNORMAL HIGH (ref 0.2–0.5)
Beta Globulin: 0.5 g/dL (ref 0.4–0.6)
Gamma Globulin: 1.1 g/dL (ref 0.8–1.7)
Total Protein: 7.4 g/dL (ref 6.1–8.1)

## 2018-07-02 LAB — CBC
HCT: 36.1 % — ABNORMAL LOW (ref 38.5–50.0)
Hemoglobin: 12.1 g/dL — ABNORMAL LOW (ref 13.2–17.1)
MCH: 30.3 pg (ref 27.0–33.0)
MCHC: 33.5 g/dL (ref 32.0–36.0)
MCV: 90.3 fL (ref 80.0–100.0)
MPV: 9.7 fL (ref 7.5–12.5)
Platelets: 407 10*3/uL — ABNORMAL HIGH (ref 140–400)
RBC: 4 10*6/uL — ABNORMAL LOW (ref 4.20–5.80)
RDW: 13.2 % (ref 11.0–15.0)
WBC: 8 10*3/uL (ref 3.8–10.8)

## 2018-07-02 LAB — IRON,TIBC AND FERRITIN PANEL
%SAT: 14 % (calc) — ABNORMAL LOW (ref 20–48)
Ferritin: 113 ng/mL (ref 24–380)
Iron: 46 ug/dL — ABNORMAL LOW (ref 50–180)
TIBC: 323 mcg/dL (calc) (ref 250–425)

## 2018-07-02 LAB — TESTOSTERONE: Testosterone: 276 ng/dL (ref 250–827)

## 2018-07-02 LAB — VITAMIN B1: Vitamin B1 (Thiamine): 40 nmol/L — ABNORMAL HIGH (ref 8–30)

## 2018-07-04 NOTE — Addendum Note (Signed)
Addended by: Jon Billings on: 07/04/2018 08:28 AM   Modules accepted: Orders

## 2018-07-08 ENCOUNTER — Other Ambulatory Visit (INDEPENDENT_AMBULATORY_CARE_PROVIDER_SITE_OTHER): Payer: BLUE CROSS/BLUE SHIELD

## 2018-07-08 DIAGNOSIS — R778 Other specified abnormalities of plasma proteins: Secondary | ICD-10-CM | POA: Diagnosis not present

## 2018-07-08 NOTE — Addendum Note (Signed)
Addended by: Rodrigo Ran on: 07/08/2018 08:11 AM   Modules accepted: Orders

## 2018-07-12 LAB — IMMUNOFIXATION, SERUM
IgA/Immunoglobulin A, Serum: 590 mg/dL — ABNORMAL HIGH (ref 61–437)
IgG (Immunoglobin G), Serum: 1131 mg/dL (ref 603–1613)
IgM (Immunoglobulin M), Srm: 109 mg/dL (ref 20–172)

## 2018-07-14 ENCOUNTER — Ambulatory Visit (INDEPENDENT_AMBULATORY_CARE_PROVIDER_SITE_OTHER): Payer: BLUE CROSS/BLUE SHIELD | Admitting: Family Medicine

## 2018-07-14 ENCOUNTER — Encounter: Payer: Self-pay | Admitting: Family Medicine

## 2018-07-14 ENCOUNTER — Other Ambulatory Visit: Payer: Self-pay

## 2018-07-14 VITALS — BP 136/70 | HR 76 | Ht 68.0 in | Wt 162.3 lb

## 2018-07-14 DIAGNOSIS — R6882 Decreased libido: Secondary | ICD-10-CM | POA: Diagnosis not present

## 2018-07-14 DIAGNOSIS — E519 Thiamine deficiency, unspecified: Secondary | ICD-10-CM

## 2018-07-14 DIAGNOSIS — K140 Glossitis: Secondary | ICD-10-CM | POA: Diagnosis not present

## 2018-07-14 DIAGNOSIS — D649 Anemia, unspecified: Secondary | ICD-10-CM | POA: Diagnosis not present

## 2018-07-14 DIAGNOSIS — R432 Parageusia: Secondary | ICD-10-CM

## 2018-07-14 DIAGNOSIS — E611 Iron deficiency: Secondary | ICD-10-CM | POA: Diagnosis not present

## 2018-07-14 DIAGNOSIS — R778 Other specified abnormalities of plasma proteins: Secondary | ICD-10-CM

## 2018-07-14 MED ORDER — VITAMIN B-1 100 MG PO TABS: 100.0000 mg | ORAL_TABLET | Freq: Every day | ORAL | 2 refills | Status: DC

## 2018-07-14 NOTE — Progress Notes (Signed)
Established Patient Office Visit  Subjective:  Patient ID: Luke Odonnell, male    DOB: 02-05-1956  Age: 64 y.o. MRN: [id number]  CC:  Chief Complaint  Patient presents with  . 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  Patient is 43 continues to work full-time at [employer].  He is hoping to make it another 2 years until retiring.  He recently upped his iron dosage to 65 mg twice daily.  He continues with thiamine supplementation.  Recent colonoscopy was negative.  He is seeing no blood in his stool or urine.  His stools have darkened after he started taking the iron twice daily.  He does not drink alcohol or smoke.  Recent HIV intake TSH test for negative and normal.  Recent PSA normal.  Hemoglobin has been stable but is mildly depressed.  He and I are both aware of what may be causing that.   History reviewed. No pertinent past medical history.  History reviewed. No pertinent surgical history.  History reviewed. No pertinent family history.  Social History   Socioeconomic History  . Marital status: Married    Spouse name: Not on file  . Number of children: Not on file  . Years of education: Not on file  . Highest education level: Not on file  Occupational History  . Not on file  Social Needs  . Financial resource strain: Not on file  . Food insecurity:    Worry: Not on file    Inability: Not on file  . Transportation needs:    Medical: Not on file    Non-medical: Not on file  Tobacco Use  . Smoking status: Never Smoker  . Smokeless tobacco: Never Used  Substance and Sexual Activity  . Alcohol use: Not Currently  . Drug use: Not Currently  . Sexual activity: Not on file  Lifestyle  . Physical activity:    Days per week: Not on file    Minutes per session: Not on file  . Stress: Not on file  Relationships  . Social connections:    Talks on phone: Not  on file    Gets together: Not on file    Attends religious service: Not on file    Active member of club or organization: Not on file    Attends meetings of clubs or organizations: Not on file    Relationship status: Not on file  . Intimate partner violence:    Fear of current or ex partner: Not on file    Emotionally abused: Not on file    Physically abused: Not on file    Forced sexual activity: Not on file  Other Topics Concern  . Not on file  Social History Narrative  . Not on file    Outpatient Medications Prior to Visit  Medication Sig Dispense Refill  . aspirin EC 81 MG tablet Take 81 mg by mouth daily.    . fluticasone (FLONASE) 50 MCG/ACT nasal spray Place 2 sprays into both nostrils daily. 16 g 6  . magic mouthwash SOLN Take 5 mLs by mouth 3 (three) times daily. Swish, gargle and expectorate. 150 mL 1  . PARoxetine (PAXIL) 20 MG tablet Take 1 tablet (20 mg total) by mouth daily. 90 tablet 2  . thiamine (VITAMIN B-1) 100 MG tablet Take 1 tablet (100 mg total) by mouth daily. 90 tablet 2  . valsartan-hydrochlorothiazide (  DIOVAN-HCT) 160-25 MG tablet Take 1 tablet by mouth daily. 90 tablet 2  . fluconazole (DIFLUCAN) 100 MG tablet Take 1 tablet (100 mg total) by mouth daily. 21 tablet 0   No facility-administered medications prior to visit.     Allergies  Allergen Reactions  . Neurontin [Gabapentin] Swelling    Dropped BP, in hospital for 3 days.    ROS Review of Systems  Constitutional: Positive for fatigue. Negative for chills, diaphoresis, fever and unexpected weight change.  HENT: Negative.   Eyes: Negative for photophobia and visual disturbance.  Respiratory: Negative.   Cardiovascular: Negative.   Gastrointestinal: Negative.  Negative for abdominal pain, anal bleeding and blood in stool.  Genitourinary: Negative for hematuria.  Musculoskeletal: Positive for back pain.  Skin: Negative for pallor and rash.  Hematological: Does not bruise/bleed easily.       Objective:    Physical Exam  Constitutional: He is oriented to person, place, and time. He appears well-developed and well-nourished. No distress.  HENT:  Head: Normocephalic and atraumatic.  Right Ear: External ear normal.  Left Ear: External ear normal.  Eyes: Conjunctivae are normal. Right eye exhibits no discharge.  Pulmonary/Chest: Effort normal.  Neurological: He is alert and oriented to person, place, and time.  Skin: Skin is warm and dry. He is not diaphoretic.  Psychiatric: He has a normal mood and affect. His behavior is normal.    BP 136/70   Pulse 76   Ht 5\' 8"  (1.727 m)   Wt 162 lb 5 oz (73.6 kg)   SpO2 94%   BMI 24.68 kg/m  Wt Readings from Last 3 Encounters:  07/14/18 162 lb 5 oz (73.6 kg)  04/05/18 165 lb (74.8 kg)  02/07/18 162 lb 8 oz (73.7 kg)     Health Maintenance Due  Topic Date Due  . PNA vac Low Risk Adult (2 of 2 - PPSV23) 05/26/2018    There are no preventive care reminders to display for this patient.  Lab Results  Component Value Date   TSH 0.57 03/26/2017   Lab Results  Component Value Date   WBC 8.0 06/27/2018   HGB 12.1 (L) 06/27/2018   HCT 36.1 (L) 06/27/2018   MCV 90.3 06/27/2018   PLT 407 (H) 06/27/2018   Lab Results  Component Value Date   NA 140 04/05/2018   K 4.4 04/05/2018   CO2 33 (H) 04/05/2018   GLUCOSE 88 04/05/2018   BUN 19 04/05/2018   CREATININE 1.10 04/05/2018   BILITOT 0.5 04/05/2018   ALKPHOS 79 04/05/2018   AST 22 04/05/2018   ALT 16 04/05/2018   PROT 7.4 06/27/2018   ALBUMIN 4.4 04/05/2018   CALCIUM 9.7 04/05/2018   GFR 67.21 04/05/2018   Lab Results  Component Value Date   CHOL 154 09/27/2017   Lab Results  Component Value Date   HDL 37.90 (L) 09/27/2017   Lab Results  Component Value Date   LDLCALC 91 09/27/2017   Lab Results  Component Value Date   TRIG 125.0 09/27/2017   Lab Results  Component Value Date   CHOLHDL 4 09/27/2017   No results found for: HGBA1C    Assessment &  Plan:   Problem List Items Addressed This Visit      Digestive   Glossitis - Primary     Other   Dysgeusia   Anemia   Relevant Orders   Urinalysis, Routine w reflex microscopic   Decreased sex drive   Iron deficiency  Other Visit Diagnoses    Abnormal SPEP       Relevant Orders   Ambulatory referral to Hematology      No orders of the defined types were placed in this encounter.   Follow-up: Return in about 2 months (around 09/13/2018).   Patient's hemoglobin has been stable and on the macrocytic side.  B12 and folate levels have been normal.  Iron levels have been depressed.  Believe that the iron deficiency could have more to do with the patient's glossitis than his anemia.  For his glossitis he will continue iron supplementation and thiamine supplementation.  Patient may need a biopsy of his tongue.  And considering amyloidosis.  Hematology consult for abnormal electrophoresis studies.

## 2018-07-15 LAB — URINALYSIS, ROUTINE W REFLEX MICROSCOPIC
Bilirubin Urine: NEGATIVE
Glucose, UA: NEGATIVE
Hgb urine dipstick: NEGATIVE
Ketones, ur: NEGATIVE
Leukocytes,Ua: NEGATIVE
Nitrite: NEGATIVE
Protein, ur: NEGATIVE
Specific Gravity, Urine: 1.021 (ref 1.001–1.03)
pH: <=5 (ref 5.0–8.0)

## 2018-07-25 ENCOUNTER — Other Ambulatory Visit: Payer: Self-pay | Admitting: Hematology

## 2018-07-25 DIAGNOSIS — D649 Anemia, unspecified: Secondary | ICD-10-CM

## 2018-07-25 NOTE — Progress Notes (Signed)
[facility] NOTE  Patient Care Team: Libby Maw, MD as PCP - General (Family Medicine)  HEME/ONC OVERVIEW: 1. Normocytic anemia -Likely multifactorial, including mild CKD and iron deficiency; Hgb ~12   ASSESSMENT & PLAN:   Normocytic anemia -I reviewed the patient's records in detail, including PCP clinic notes and lab studies -In summary, patient has had mild anemia (Hgb ~12) since 2019.  He had a colonoscopy, which was reportedly normal.  Iron studies were suggestive of mild iron deficiency, for treatment has been taking iron supplement BID (prescribed by PCP).  Other nutritional studies, including B12 and folate finally unremarkable.  He has had some fluctuating renal function (Cr 1.1-1.3), and his PCP ordered SPEP, which showed a rise in beta-2 globulin, suggestive of acute inflammation, but no definite M-spike. Unfortunately, no IFE was performed. There was no M-spike on UPEP.  -I discussed lab studies in detail with him, as well as the pathophysiology of plasma cell dyscrasia  -Hgb 11.4 today, stable  -I personally reviewed the peripheral blood smear, which showed normal RBC size and morphology with some immature RBC's. There was no significant schistocytosis. WBC morphology was normal and there was no platelet clumping.  -While I think there is very low suspicion for plasma cell dyscrasia, I have ordered repeat SPEP, IFE and free light chains to rule out monoclonal protein -Review of the patient's labs showed some fluctuating renal function, suggest mild chronic kidney disease, which can contribute to mild anemia -In addition, given the patient's recent history of severe left foot infection, there is likely a component of anemia of chronic disease  -I have ordered ESR and CRP to assess any inflammation -Finally, I recommended the patient to take iron supplement once every other day, as studies have shown that increased iron frequency based to decreased  absorption   Abnormal SPEP -As discussed above, SPEP showed rise in beta-2 globulin, suggestive of acute inflammation, but there was no definite monoclonal protein present; urine studies negative for M-protein -I have ordered repeat SPEP, IFE, and free light chains to assess for presence of abnormal monoclonal protein -Pending lab results above, we will determine if further work-up is warranted  Dysphagia -Patient reports intermittent dysphagia, mostly with solids, for the past year -Colonoscopy reportedly normal in 2018; review of the patient's external records showed polyps removed in 2018 but no procedure report was found -Given the persistent dysphagia, I have referred the patient to GI for further evaluation   Orders Placed This Encounter  Procedures  . CBC with Differential ([facility] Only)    Standing Status:   Future    Standing Expiration Date:   09/06/2019  . CMP ([facility] only)    Standing Status:   Future    Standing Expiration Date:   09/06/2019  . Save Smear ([facility])    Standing Status:   Future    Standing Expiration Date:   08/02/2019  . Ambulatory referral to Gastroenterology    Referral Priority:   Routine    Referral Type:   Consultation    Referral Reason:   Specialty Services Required    Number of Visits Requested:   1   All questions were answered. The patient knows to call the clinic with any problems, questions or concerns.  Return in 2 months for labs and clinic follow-up.   Tish Men, MD 08/02/2018 12:46 PM   CHIEF COMPLAINTS/PURPOSE OF CONSULTATION:  "I am told that my red blood cell is low*"  HISTORY  OF PRESENTING ILLNESS:  Luke Odonnell 63 y.o. male is here because of mild normocytic anemia.  Mr. Stapel has had mild chronic normocytic anemia dating back to at least 2018, for which he has been taking iron supplement, prescribed by his PCP.  He reports that he had a colonoscopy in May 2018, which removed some polyps, but not identify any source of  bleeding.  He has had intermittent dysphasia, mostly with solid food, for the past year, but has not had any weight loss or symptoms of aspiration.  He denies any abdominal pain, hematemesis, hematochezia, or melena.  He has been seen ENT for white plaques on his tongue, and has been treated medically, though for thrush.  In the fall 2019, he had a severe left foot that started as an abscess, and he was treated with several months of oral antibiotics.  He reports mild persistent fatigue, but otherwise denies any constitutional symptoms.  He denies any other complaint today.  MEDICAL HISTORY:  History reviewed. No pertinent past medical history.  SURGICAL HISTORY: History reviewed. No pertinent surgical history.  SOCIAL HISTORY: Social History   Socioeconomic History  . Marital status: Married    Spouse name: Not on file  . Number of children: Not on file  . Years of education: Not on file  . Highest education level: Not on file  Occupational History  . Not on file  Social Needs  . Financial resource strain: Not on file  . Food insecurity    Worry: Not on file    Inability: Not on file  . Transportation needs    Medical: Not on file    Non-medical: Not on file  Tobacco Use  . Smoking status: Never Smoker  . Smokeless tobacco: Never Used  Substance and Sexual Activity  . Alcohol use: Not Currently  . Drug use: Not Currently  . Sexual activity: Not on file  Lifestyle  . Physical activity    Days per week: Not on file    Minutes per session: Not on file  . Stress: Not on file  Relationships  . Social Herbalist on phone: Not on file    Gets together: Not on file    Attends religious service: Not on file    Active member of club or organization: Not on file    Attends meetings of clubs or organizations: Not on file    Relationship status: Not on file  . Intimate partner violence    Fear of current or ex partner: Not on file    Emotionally abused: Not on file     Physically abused: Not on file    Forced sexual activity: Not on file  Other Topics Concern  . Not on file  Social History Narrative  . Not on file    FAMILY HISTORY: History reviewed. No pertinent family history.  ALLERGIES:  is allergic to neurontin [gabapentin].  MEDICATIONS:  Current Outpatient Medications  Medication Sig Dispense Refill  . aspirin EC 81 MG tablet Take 81 mg by mouth daily.    . fluticasone (FLONASE) 50 MCG/ACT nasal spray Place 2 sprays into both nostrils daily. 16 g 6  . magic mouthwash SOLN Take 5 mLs by mouth 3 (three) times daily. Swish, gargle and expectorate. 150 mL 1  . PARoxetine (PAXIL) 20 MG tablet Take 1 tablet (20 mg total) by mouth daily. 90 tablet 2  . thiamine (VITAMIN B-1) 100 MG tablet Take 1 tablet (100 mg total) by  mouth daily. 90 tablet 2  . valsartan-hydrochlorothiazide (DIOVAN-HCT) 160-25 MG tablet Take 1 tablet by mouth daily. 90 tablet 2   No current facility-administered medications for this visit.     REVIEW OF SYSTEMS:   Constitutional: ( - ) fevers, ( - )  chills , ( - ) night sweats Eyes: ( - ) blurriness of vision, ( - ) double vision, ( - ) watery eyes Ears, nose, mouth, throat, and face: ( - ) mucositis, ( - ) sore throat Respiratory: ( - ) cough, ( - ) dyspnea, ( - ) wheezes Cardiovascular: ( - ) palpitation, ( - ) chest discomfort, ( - ) lower extremity swelling Gastrointestinal:  ( - ) nausea, ( - ) heartburn, ( - ) change in bowel habits Skin: ( - ) abnormal skin rashes Lymphatics: ( - ) new lymphadenopathy, ( - ) easy bruising Neurological: ( - ) numbness, ( - ) tingling, ( - ) new weaknesses Behavioral/Psych: ( - ) mood change, ( - ) new changes  All other systems were reviewed with the patient and are negative.  PHYSICAL EXAMINATION: ECOG PERFORMANCE STATUS: 1 - Symptomatic but completely ambulatory  Vitals:   08/02/18 1218  BP: (!) 131/95  Pulse: 63  Resp: 18  Temp: 98.7 F (37.1 C)  SpO2: 99%   Filed  Weights   08/02/18 1218  Weight: 168 lb 12.8 oz (76.6 kg)    GENERAL: alert, no distress and comfortable SKIN: skin color, texture, turgor are normal, no rashes or significant lesions EYES: conjunctiva are pink and non-injected, sclera clear OROPHARYNX: no exudate, no erythema; lips, buccal mucosa normal  NECK: supple, non-tender LUNGS: clear to auscultation with normal breathing effort HEART: regular rate & rhythm, no murmurs, no lower extremity edema ABDOMEN: soft, non-tender, non-distended, normal bowel sounds Musculoskeletal: no cyanosis of digits and no clubbing  PSYCH: alert & oriented x 3, fluent speech NEURO: no focal motor/sensory deficits  LABORATORY DATA:  I have reviewed the data as listed Lab Results  Component Value Date   WBC 6.6 08/02/2018   HGB 11.4 (L) 08/02/2018   HCT 34.4 (L) 08/02/2018   MCV 90.5 08/02/2018   PLT 327 08/02/2018   Lab Results  Component Value Date   NA 141 08/02/2018   K 3.8 08/02/2018   CL 103 08/02/2018   CO2 30 08/02/2018   PATHOLOGY: I personally reviewed the patient's peripheral blood smear today.  The red blood cells were of normal morphology with some immature RBC's.  There was no schistocytosis.  The white blood cells were of normal morphology. There were no peripheral circulating blasts. The platelets were of normal size and I verified that there were no platelet clumping.

## 2018-08-02 ENCOUNTER — Inpatient Hospital Stay: Payer: BC Managed Care – PPO

## 2018-08-02 ENCOUNTER — Encounter: Payer: Self-pay | Admitting: Hematology

## 2018-08-02 ENCOUNTER — Other Ambulatory Visit: Payer: Self-pay

## 2018-08-02 ENCOUNTER — Inpatient Hospital Stay: Payer: BC Managed Care – PPO | Attending: Hematology | Admitting: Hematology

## 2018-08-02 ENCOUNTER — Telehealth: Payer: Self-pay | Admitting: Hematology

## 2018-08-02 VITALS — BP 131/95 | HR 63 | Temp 98.7°F | Resp 18 | Ht 68.0 in | Wt 168.8 lb

## 2018-08-02 DIAGNOSIS — Z79899 Other long term (current) drug therapy: Secondary | ICD-10-CM | POA: Diagnosis not present

## 2018-08-02 DIAGNOSIS — R7989 Other specified abnormal findings of blood chemistry: Secondary | ICD-10-CM | POA: Diagnosis not present

## 2018-08-02 DIAGNOSIS — D649 Anemia, unspecified: Secondary | ICD-10-CM

## 2018-08-02 DIAGNOSIS — N182 Chronic kidney disease, stage 2 (mild): Secondary | ICD-10-CM | POA: Diagnosis not present

## 2018-08-02 DIAGNOSIS — R1310 Dysphagia, unspecified: Secondary | ICD-10-CM

## 2018-08-02 DIAGNOSIS — N183 Chronic kidney disease, stage 3 unspecified: Secondary | ICD-10-CM | POA: Insufficient documentation

## 2018-08-02 DIAGNOSIS — E611 Iron deficiency: Secondary | ICD-10-CM

## 2018-08-02 DIAGNOSIS — R778 Other specified abnormalities of plasma proteins: Secondary | ICD-10-CM

## 2018-08-02 LAB — CMP (CANCER CENTER ONLY)
ALT: 16 U/L (ref 0–44)
AST: 27 U/L (ref 15–41)
Albumin: 4.1 g/dL (ref 3.5–5.0)
Alkaline Phosphatase: 61 U/L (ref 38–126)
Anion gap: 8 (ref 5–15)
BUN: 15 mg/dL (ref 8–23)
CO2: 30 mmol/L (ref 22–32)
Calcium: 9.1 mg/dL (ref 8.9–10.3)
Chloride: 103 mmol/L (ref 98–111)
Creatinine: 1.15 mg/dL (ref 0.61–1.24)
GFR, Est AFR Am: >60 mL/min (ref >60)
GFR, Estimated: >60 mL/min (ref >60)
Glucose, Bld: 105 mg/dL — ABNORMAL HIGH (ref 70–99)
Potassium: 3.8 mmol/L (ref 3.5–5.1)
Sodium: 141 mmol/L (ref 135–145)
Total Bilirubin: 0.8 mg/dL (ref 0.3–1.2)
Total Protein: 6.8 g/dL (ref 6.5–8.1)

## 2018-08-02 LAB — CBC WITH DIFFERENTIAL (CANCER CENTER ONLY)
Abs Immature Granulocytes: 0.02 10*3/uL (ref 0.00–0.07)
Basophils Absolute: 0 10*3/uL (ref 0.0–0.1)
Basophils Relative: 1 %
Eosinophils Absolute: 0.1 10*3/uL (ref 0.0–0.5)
Eosinophils Relative: 1 %
HCT: 34.4 % — ABNORMAL LOW (ref 39.0–52.0)
Hemoglobin: 11.4 g/dL — ABNORMAL LOW (ref 13.0–17.0)
Immature Granulocytes: 0 %
Lymphocytes Relative: 26 %
Lymphs Abs: 1.7 10*3/uL (ref 0.7–4.0)
MCH: 30 pg (ref 26.0–34.0)
MCHC: 33.1 g/dL (ref 30.0–36.0)
MCV: 90.5 fL (ref 80.0–100.0)
Monocytes Absolute: 0.6 10*3/uL (ref 0.1–1.0)
Monocytes Relative: 9 %
Neutro Abs: 4.2 10*3/uL (ref 1.7–7.7)
Neutrophils Relative %: 63 %
Platelet Count: 327 10*3/uL (ref 150–400)
RBC: 3.8 MIL/uL — ABNORMAL LOW (ref 4.22–5.81)
RDW: 13.1 % (ref 11.5–15.5)
WBC Count: 6.6 10*3/uL (ref 4.0–10.5)
nRBC: 0 % (ref 0.0–0.2)

## 2018-08-02 LAB — SEDIMENTATION RATE: Sed Rate: 9 mm/hr (ref 0–16)

## 2018-08-02 LAB — C-REACTIVE PROTEIN: CRP: <0.8 mg/dL (ref <1.0)

## 2018-08-02 LAB — SAVE SMEAR(SSMR), FOR PROVIDER SLIDE REVIEW

## 2018-08-02 NOTE — Patient Instructions (Signed)
Multiple Myeloma  Multiple myeloma is a form of cancer. It develops when abnormal plasma cells grow out of control. Plasma cells are a type of white blood cell that is made in the soft tissue inside the bones (bone marrow). They are part of the body's disease-fighting system (immune system). Multiple myeloma damages bones and causes other health problems because of its effect on blood cells. Abnormal plasma cells produce monoclonal proteins (M proteins) and interfere with many important functions that normal cells perform in the body. The disease gets worse over time (progresses) and reduces the body's ability to fight infections. What are the causes? The cause of multiple myeloma is not known. What increases the risk? You are more likely to develop this condition if you:  Are older than age 65.  Are male.  Are African American.  Have a family history of multiple myeloma.  Have a history of monoclonal gammopathy of undetermined significance (MGUS).  Have a history of radiation exposure.  Have been exposed to certain chemicals, such as benzene or pesticides. What are the signs or symptoms? Signs and symptoms of multiple myeloma may include:  Bone pain, especially in the back, ribs, and hips.  Broken bones (fractures).  Having a low level of red blood cells (anemia), white blood cells (leukopenia), and platelets (thrombocytopenia). Platelets are cells that help blood to clot so a wound does not keep bleeding.  Fatigue.  Weakness.  Infections.  Unusual bleeding, such as: ? Bleeding from the nose or gums. ? Bleeding a lot from a small scrape or cut.  High blood calcium levels.  Increased urination.  Confusion.  Shortness of breath.  Weakness or numbness in your legs.  Sudden, severe back pain. How is this diagnosed? This condition is diagnosed based on your symptoms, your medical history, and a physical exam. You will have blood and urine tests to confirm that M  proteins are present. You may also have other tests, including:  Additional blood tests.  X-rays.  MRI.  CT scan.  PET scan.  Tests to check the function of your kidneys.  Heart tests, such as an echocardiogram. An echocardiogram uses sound waves to produce an image of the heart.  A procedure to remove a sample of bone marrow (bone marrow biopsy). The sample is examined for abnormal plasma cells. How is this treated? There is no cure for multiple myeloma. However, treatments can manage symptoms and slow the progression of the disease. Treatment options may vary depending on how much the disease has advanced. Possible treatment options may include:  Medicines that kill cancer cells (chemotherapy).  Radiation therapy. This is the use of high-energy rays to kill cancer cells.  A bone marrow transplant. This procedure replaces diseased bone marrow with healthy bone marrow (stem cell transplant).  Medicines that block the growth and spread of cancer cells (targeted drug therapy).  Medicines that strengthen your immune system's ability to fight cancer cells (immunotherapy or biologic therapy).  Participating in clinical trials to find out if new (experimental) treatments are effective.  Medicines that help to prevent bone damage (bisphosphonates).  Medicines that reduce swelling (corticosteroids).  Surgery to repair bone damage.  A procedure to remove plasma cells from your blood (plasmapheresis).  Other medicines to treat problems such as infections or pain. Follow these instructions at home:  Eating and drinking  Drink enough fluid to keep your urine pale yellow.  Try to eat healthy meals on a regular basis. Some of your treatments might affect your   appetite. If you are having problems eating or if you do not have an appetite, meet with a diet and nutrition specialist (dietitian).  Take vitamins or supplements only as told by your health care provider or dietitian. Some  vitamins and supplements may interfere with how well your treatment works. General instructions  Take over-the-counter and prescription medicines only as told by your health care provider.  Stay active. Talk with your health care provider about what types of exercises and activities are safe for you. ? Avoid activities that cause increased pain. ? Do not lift anything that is heavier than 10 lb (4.5 kg), or the limit that you are told, until your health care provider says that it is safe.  Consider joining a support group or getting counseling to help you cope with the stress of having multiple myeloma.  Keep all follow-up visits as told by your health care provider. This is important. Where to find more information  American Cancer Society: www.cancer.org  Leukemia and Lymphoma Society: www.LLS.org  National Cancer Institute (NCI): www.cancer.gov Contact a health care provider if you:  Have pain that gets worse or does not get better with medicine.  Have a fever.  Have swollen legs.  Have weakness or dizziness.  Have unexplained weight loss.  Have unexplained bleeding or bruising.  Have a cough or symptoms of the common cold.  Feel depressed.  Have changes in urination or bowel movements. Get help right away if you:  Have sudden severe pain, especially back pain.  Have numbness or weakness in your arms, hands, legs, or feet.  Become very confused.  Have weakness on one side of your body.  Have slurred speech.  Have trouble staying awake.  Have shortness of breath.  Have blood in your stool (feces) or urine.  Vomit blood or cough up blood. Summary  Multiple myeloma is a form of cancer. It develops when abnormal plasma cells grow out of control.  There is no cure for multiple myeloma. However, treatments can manage symptoms and slow the progression of the disease. Treatment options may vary depending on how much the disease has advanced.  Do not lift  anything that is heavier than 10 lb (4.5 kg), or the limit that you are told, until your health care provider says that it is safe.  Contact your health care provider if you have any new symptoms or sudden severe pain, especially back pain. This information is not intended to replace advice given to you by your health care provider. Make sure you discuss any questions you have with your health care provider. Document Released: 10/28/2000 Document Revised: 12/02/2016 Document Reviewed: 12/02/2016 Elsevier Interactive Patient Education  2019 Elsevier Inc.  

## 2018-08-02 NOTE — Telephone Encounter (Signed)
lmvm with date/time of appointments added per 6/16 los

## 2018-08-03 LAB — KAPPA/LAMBDA LIGHT CHAINS
Kappa free light chain: 29.9 mg/L — ABNORMAL HIGH (ref 3.3–19.4)
Kappa, lambda light chain ratio: 1.23 (ref 0.26–1.65)
Lambda free light chains: 24.4 mg/L (ref 5.7–26.3)

## 2018-08-03 LAB — MULTIPLE MYELOMA PANEL, SERUM
Albumin SerPl Elph-Mcnc: 3.5 g/dL (ref 2.9–4.4)
Albumin/Glob SerPl: 1.2 (ref 0.7–1.7)
Alpha 1: 0.2 g/dL (ref 0.0–0.4)
Alpha2 Glob SerPl Elph-Mcnc: 0.7 g/dL (ref 0.4–1.0)
B-Globulin SerPl Elph-Mcnc: 1.4 g/dL — ABNORMAL HIGH (ref 0.7–1.3)
Gamma Glob SerPl Elph-Mcnc: 0.9 g/dL (ref 0.4–1.8)
Globulin, Total: 3.1 g/dL (ref 2.2–3.9)
IgA: 519 mg/dL — ABNORMAL HIGH (ref 61–437)
IgG (Immunoglobin G), Serum: 1023 mg/dL (ref 603–1613)
IgM (Immunoglobulin M), Srm: 100 mg/dL (ref 20–172)
Total Protein ELP: 6.6 g/dL (ref 6.0–8.5)

## 2018-08-03 LAB — SOLUBLE TRANSFERRIN RECEPTOR: Transferrin Receptor: 10.3 nmol/L — ABNORMAL LOW (ref 12.2–27.3)

## 2018-08-04 ENCOUNTER — Other Ambulatory Visit: Payer: Self-pay | Admitting: Hematology

## 2018-08-05 ENCOUNTER — Telehealth: Payer: Self-pay | Admitting: *Deleted

## 2018-08-05 NOTE — Telephone Encounter (Signed)
-----  Message from Tish Men, MD sent at 08/04/2018  8:48 AM EDT ----- Can we let Mr. Hyun know that his labs did not show any abnormal monoclonal protein in the blood to suggest blood cancer (ie multiple myeloma)? He should follow up with GI as scheduled.  Thanks.  [place]  ----- Message ----- From: Buel Ream, [facility] [place] Sent: 08/02/2018  11:54 AM EDT To: Tish Men, MD

## 2018-08-05 NOTE — Telephone Encounter (Signed)
As noted below by Dr. Maylon Peppers, I informed patient that lab values didn't show any abnormal monoclonal protein in the blood, to suggest blood cancer. Please follow up with GI as scheduled. He verbalized understanding.

## 2018-08-30 ENCOUNTER — Ambulatory Visit: Payer: BC Managed Care – PPO | Admitting: Gastroenterology

## 2018-09-05 ENCOUNTER — Telehealth: Payer: Self-pay | Admitting: General Surgery

## 2018-09-05 NOTE — Telephone Encounter (Signed)
Voicemail left to prescreen for covid 19

## 2018-09-05 NOTE — Telephone Encounter (Signed)
Left voicemail for the patient to contact the office to prescreen for Covid

## 2018-09-06 ENCOUNTER — Telehealth: Payer: Self-pay | Admitting: Internal Medicine

## 2018-09-06 ENCOUNTER — Telehealth: Payer: Self-pay | Admitting: General Surgery

## 2018-09-06 ENCOUNTER — Ambulatory Visit: Payer: BC Managed Care – PPO | Admitting: Gastroenterology

## 2018-09-06 ENCOUNTER — Encounter: Payer: Self-pay | Admitting: Gastroenterology

## 2018-09-06 VITALS — BP 130/68 | HR 69 | Temp 98.8°F | Ht 68.0 in | Wt 164.0 lb

## 2018-09-06 DIAGNOSIS — R0989 Other specified symptoms and signs involving the circulatory and respiratory systems: Secondary | ICD-10-CM | POA: Diagnosis not present

## 2018-09-06 DIAGNOSIS — K219 Gastro-esophageal reflux disease without esophagitis: Secondary | ICD-10-CM | POA: Diagnosis not present

## 2018-09-06 DIAGNOSIS — R1310 Dysphagia, unspecified: Secondary | ICD-10-CM | POA: Diagnosis not present

## 2018-09-06 MED ORDER — PANTOPRAZOLE SODIUM 40 MG PO TBEC: 40.0000 mg | DELAYED_RELEASE_TABLET | Freq: Every day | ORAL | 1 refills | Status: DC

## 2018-09-06 NOTE — Telephone Encounter (Signed)
Left a voicemail for the patient to contact the office before coming in to screen for Covid 19 since his appointment is in person.

## 2018-09-06 NOTE — Progress Notes (Addendum)
09/06/2018 Luke Odonnell [id number] [birth year]   HISTORY OF PRESENT ILLNESS:  This is a 63 year old male who is new to our office.  Previous seen at [facility], had a colonoscopy there in 2018 and says that he had an EGD there over 10 years ago.  As far as he can remember the EGD was normal.  He is here today with complaints of dysphagia and globus sensation.  Says that these symptoms have been present for the past year or so.  Says that the dysphagia only occurs with food, but happens with almost every time that he eats.  Constantly feels like there is something in this throat.  Has intermittent reflux and just takes Nexium OTC prn about once per week or so.  Sometimes feels water/reflux coming back up into his throat.  No abdominal pain, no weight loss.  Referred by Dr. Maylon Peppers.   Past Medical History:  Diagnosis Date  . Abnormal SPEP   . Adenomatous polyp of colon 2003  . Decreased sex drive   . Depression   . Dysgeusia   . Hiatal hernia   . Hypertension   . Iron deficiency anemia   . Thiamine deficiency    Past Surgical History:  Procedure Laterality Date  . BACK SURGERY     x 4: neck and lower back fusions    reports that he has never smoked. He has never used smokeless tobacco. He reports current alcohol use of about 1.0 standard drinks of alcohol per week. He reports previous drug use. family history includes Colonic polyp in his father; Stroke in his father. Allergies  Allergen Reactions  . Neurontin [Gabapentin] Swelling    Dropped BP, in hospital for 3 days.      Outpatient Encounter Medications as of 09/06/2018  Medication Sig  . aspirin EC 81 MG tablet Take 81 mg by mouth daily.  . fluticasone (FLONASE) 50 MCG/ACT nasal spray Place 2 sprays into both nostrils daily.  Marland Kitchen PARoxetine (PAXIL) 20 MG tablet Take 1 tablet (20 mg total) by mouth daily.  Marland Kitchen thiamine (VITAMIN B-1) 100 MG tablet Take 1 tablet (100 mg total) by mouth daily.  . valsartan-hydrochlorothiazide  (DIOVAN-HCT) 160-25 MG tablet Take 1 tablet by mouth daily.  . [DISCONTINUED] magic mouthwash SOLN Take 5 mLs by mouth 3 (three) times daily. Swish, gargle and expectorate.   No facility-administered encounter medications on file as of 09/06/2018.      REVIEW OF SYSTEMS  : All other systems reviewed and negative except where noted in the History of Present Illness.   PHYSICAL EXAM: BP 130/68   Pulse 69   Temp 98.8 F (37.1 C)   Ht 5\' 8"  (1.727 m)   Wt 164 lb (74.4 kg)   BMI 24.94 kg/m  General: Well developed white male in no acute distress Head: Normocephalic and atraumatic Eyes:  Sclerae anicteric, conjunctiva pink. Ears: Normal auditory acuity Lungs: Clear throughout to auscultation; no increased WOB Heart: Regular rate and rhythm; no M/R/G. Abdomen: Soft, non-distended.  BS present.  Non-tender. Musculoskeletal: Symmetrical with no gross deformities  Skin: No lesions on visible extremities Extremities: No edema  Neurological: Alert oriented x 4, grossly non-focal Psychological:  Alert and cooperative. Normal mood and affect  ASSESSMENT AND PLAN: *Dysphagia/globus sensation:  Dysphagia to solid foods for the past year.  Occurring with almost every meal.  Will start pantoprazole 40 mg daily.  Prescription sent.  Will plan for EGD with possible dilation with  Dr. Hilarie Fredrickson.  **The risks, benefits, and alternatives to EGD with dilation were discussed with the patient and he consents to proceed.   **Will get previous GI records from [facility].   CC:  Tish Men, MD  Addendum: Reviewed and agree with assessment and management plan. Pyrtle, Lajuan Lines, MD

## 2018-09-06 NOTE — Patient Instructions (Signed)
We have sent the following medications to your pharmacy for you to pick up at your convenience: Pantoprazole 40mg  1 qd  You have been scheduled for an endoscopy on 09/07/2018 @10 :30am. Please follow the written instructions given to you at your visit today. Please pick up your prep supplies at the pharmacy within the next 1-3 days. If you use inhalers (even only as needed), please bring them with you on the day of your procedure.

## 2018-09-06 NOTE — Telephone Encounter (Signed)

## 2018-09-07 ENCOUNTER — Ambulatory Visit (AMBULATORY_SURGERY_CENTER): Payer: BC Managed Care – PPO | Admitting: Internal Medicine

## 2018-09-07 ENCOUNTER — Encounter: Payer: Self-pay | Admitting: Internal Medicine

## 2018-09-07 ENCOUNTER — Other Ambulatory Visit: Payer: Self-pay

## 2018-09-07 VITALS — BP 114/61 | HR 54 | Temp 98.4°F | Resp 14 | Ht 68.0 in | Wt 164.0 lb

## 2018-09-07 DIAGNOSIS — R1310 Dysphagia, unspecified: Secondary | ICD-10-CM

## 2018-09-07 MED ORDER — SODIUM CHLORIDE 0.9 % IV SOLN: 500.0000 mL | Freq: Once | INTRAVENOUS | Status: DC

## 2018-09-07 NOTE — Progress Notes (Signed)
Temp taken by CW VS taken by JB 

## 2018-09-07 NOTE — Progress Notes (Signed)
Called to room to assist during endoscopic procedure.  Patient ID and intended procedure confirmed with present staff. Received instructions for my participation in the procedure from the performing physician.  

## 2018-09-07 NOTE — Op Note (Signed)
[facility] Patient Name: Luke Odonnell Procedure Date: 09/07/2018 9:51 AM MRN: [id number] Endoscopist: Jerene Bears , MD Age: 63 Referring MD:  Date of Birth: Jan 18, 1956 Gender: Male Account #: [id number] Procedure:                Upper GI endoscopy Indications:              Dysphagia, Globus sensation Medicines:                Monitored Anesthesia Care Procedure:                Pre-Anesthesia Assessment:                           - Prior to the procedure, a History and Physical                            was performed, and patient medications and                            allergies were reviewed. The patient's tolerance of                            previous anesthesia was also reviewed. The risks                            and benefits of the procedure and the sedation                            options and risks were discussed with the patient.                            All questions were answered, and informed consent                            was obtained. Prior Anticoagulants: The patient has                            taken no previous anticoagulant or antiplatelet                            agents. ASA Grade Assessment: II - A patient with                            mild systemic disease. After reviewing the risks                            and benefits, the patient was deemed in                            satisfactory condition to undergo the procedure.                           After obtaining informed consent, the endoscope was  passed under direct vision. Throughout the                            procedure, the patient's blood pressure, pulse, and                            oxygen saturations were monitored continuously. The                            Endoscope was introduced through the mouth, and                            advanced to the second part of duodenum. The upper                            GI endoscopy was accomplished  without difficulty.                            The patient tolerated the procedure well. Scope In: Scope Out: Findings:                 The examined esophagus was normal. Z-line regular                            at 40 cm.                           No endoscopic abnormality was evident in the                            esophagus to explain the patient's complaint of                            dysphagia. It was decided, however, to proceed with                            dilation of the entire esophagus. The scope was                            withdrawn. Dilation was performed with a Maloney                            dilator with minimal resistance at 107 Fr and then                            54 Fr.                           The cardia and gastric fundus were normal on                            retroflexion.                           The entire examined stomach was normal.  The examined duodenum was normal. Complications:            No immediate complications. Estimated Blood Loss:     Estimated blood loss: none. Impression:               - Normal esophagus.                           - Esophageal dilation with Maloney at 73 and 6 Fr.                           - Normal stomach.                           - Normal examined duodenum.                           - No specimens collected. Recommendation:           - Patient has a contact number available for                            emergencies. The signs and symptoms of potential                            delayed complications were discussed with the                            patient. Return to normal activities tomorrow.                            Written discharge instructions were provided to the                            patient.                           - Resume previous diet after post-dilation diet                            protocol.                           - Continue present medications.                            - Would proceed with pantoprazole 40 mg once daily                            as recommended yesterday by Alonza Bogus, PA-C.                            Would plan 8 weeks of therapy and then assess                            response (globus and GERD symptoms).                           -  Office followup in 3 months for continuity.                           - Please call my office if your swallowing fails to                            improve. Jerene Bears, MD 09/07/2018 10:22:52 AM This report has been signed electronically.

## 2018-09-07 NOTE — Patient Instructions (Signed)
Discharge instructions given. Handout on a dilatation diet. Resume previous medications. YOU HAD AN ENDOSCOPIC PROCEDURE TODAY AT [facility]:   Refer to the procedure report that was given to you for any specific questions about what was found during the examination.  If the procedure report does not answer your questions, please call your gastroenterologist to clarify.  If you requested that your care partner not be given the details of your procedure findings, then the procedure report has been included in a sealed envelope for you to review at your convenience later.  YOU SHOULD EXPECT: Some feelings of bloating in the abdomen. Passage of more gas than usual.  Walking can help get rid of the air that was put into your GI tract during the procedure and reduce the bloating. If you had a lower endoscopy (such as a colonoscopy or flexible sigmoidoscopy) you may notice spotting of blood in your stool or on the toilet paper. If you underwent a bowel prep for your procedure, you may not have a normal bowel movement for a few days.  Please Note:  You might notice some irritation and congestion in your nose or some drainage.  This is from the oxygen used during your procedure.  There is no need for concern and it should clear up in a day or so.  SYMPTOMS TO REPORT IMMEDIATELY:   Following upper endoscopy (EGD)  Vomiting of blood or coffee ground material  New chest pain or pain under the shoulder blades  Painful or persistently difficult swallowing  New shortness of breath  Fever of 100F or higher  Black, tarry-looking stools  For urgent or emergent issues, a gastroenterologist can be reached at any hour by calling [phone].   DIET:  We do recommend a small meal at first, but then you may proceed to your regular diet.  Drink plenty of fluids but you should avoid alcoholic beverages for 24 hours.  ACTIVITY:  You should plan to take it easy for the rest of today and you  should NOT DRIVE or use heavy machinery until tomorrow (because of the sedation medicines used during the test).    FOLLOW UP: Our staff will call the number listed on your records 48-72 hours following your procedure to check on you and address any questions or concerns that you may have regarding the information given to you following your procedure. If we do not reach you, we will leave a message.  We will attempt to reach you two times.  During this call, we will ask if you have developed any symptoms of COVID 19. If you develop any symptoms (ie: fever, flu-like symptoms, shortness of breath, cough etc.) before then, please call [phone].  If you test positive for Covid 19 in the 2 weeks post procedure, please call and report this information to [place].    If any biopsies were taken you will be contacted by phone or by letter within the next 1-3 weeks.  Please call us at [phone] if you have not heard about the biopsies in 3 weeks.    SIGNATURES/CONFIDENTIALITY: You and/or your care partner have signed paperwork which will be entered into your electronic medical record.  These signatures attest to the fact that that the information above on your After Visit Summary has been reviewed and is understood.  Full responsibility of the confidentiality of this discharge information lies with you and/or your care-partner.

## 2018-09-07 NOTE — Progress Notes (Signed)
Report to PACU, RN, vss, BBS= Clear.  

## 2018-09-09 ENCOUNTER — Telehealth: Payer: Self-pay

## 2018-09-09 NOTE — Telephone Encounter (Signed)
  Follow up Call-  Call back number 09/07/2018  Post procedure Call Back phone  # [phone]  Permission to leave phone message Yes  Some recent data might be hidden     Message left Will try again midday

## 2018-09-13 ENCOUNTER — Telehealth: Payer: Self-pay | Admitting: Behavioral Health

## 2018-09-13 ENCOUNTER — Ambulatory Visit: Payer: BC Managed Care – PPO | Admitting: Family Medicine

## 2018-09-13 ENCOUNTER — Encounter: Payer: Self-pay | Admitting: Family Medicine

## 2018-09-13 ENCOUNTER — Other Ambulatory Visit: Payer: Self-pay

## 2018-09-13 VITALS — BP 120/70 | HR 75 | Ht 68.0 in | Wt 164.4 lb

## 2018-09-13 DIAGNOSIS — K140 Glossitis: Secondary | ICD-10-CM | POA: Diagnosis not present

## 2018-09-13 DIAGNOSIS — R252 Cramp and spasm: Secondary | ICD-10-CM

## 2018-09-13 DIAGNOSIS — I10 Essential (primary) hypertension: Secondary | ICD-10-CM | POA: Diagnosis not present

## 2018-09-13 NOTE — Telephone Encounter (Signed)

## 2018-09-13 NOTE — Progress Notes (Signed)
Established Patient Office Visit  Subjective:  Patient ID: Luke Odonnell, male    DOB: 1955-11-11  Age: 63 y.o. MRN: [id number]  CC:  Chief Complaint  Patient presents with  . 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  Dysphasia has been improved status post recent EGD with dilation.  We will continue therapy with Protonix.  Blood pressures well controlled with the Diovan HCT.  Tolerating the drug well.  Glossitis seems to be improved.  He is no longer noticing as much irritation with his tongue.  Does not feel as though the tongue has been swollen or increased in size.  He is having spontaneous muscle cramps in his flexor forearms and legs when he comes home from work.  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  Past Medical History:  Diagnosis Date  . Abnormal SPEP   . Adenomatous polyp of colon 2003  . Decreased sex drive   . Depression   . Dysgeusia   . Hiatal hernia   . Hypertension   . Iron deficiency anemia   . Thiamine deficiency     Past Surgical History:  Procedure Laterality Date  . BACK SURGERY     x 4: neck and lower back fusions    Family History  Problem Relation Age of Onset  . Colonic polyp Father   . Stroke Father   . Esophageal cancer Neg Hx   . Colon cancer Neg Hx   . Rectal cancer Neg Hx   . Stomach cancer Neg Hx     Social History   Socioeconomic History  . Marital status: Married    Spouse name: Not on file  . Number of children: Not on file  . Years of education: Not on file  . Highest education level: Not on file  Occupational History  . Not on file  Social Needs  . Financial resource strain: Not on file  . Food insecurity    Worry: Not on file    Inability: Not on file  . Transportation needs    Medical: Not on file    Non-medical: Not on file  Tobacco Use  . Smoking status: Never Smoker  . Smokeless tobacco: Never Used  Substance and Sexual Activity  . Alcohol  use: Yes    Alcohol/week: 1.0 standard drinks    Types: 1 Cans of beer per week  . Drug use: Not Currently  . Sexual activity: Not on file  Lifestyle  . Physical activity    Days per week: Not on file    Minutes per session: Not on file  . Stress: Not on file  Relationships  . Social Herbalist on phone: Not on file    Gets together: Not on file    Attends religious service: Not on file    Active member of club or organization: Not on file    Attends meetings of clubs or organizations: Not on file    Relationship status: Not on file  . Intimate partner violence    Fear of current or ex partner: Not on file    Emotionally abused: Not on file    Physically abused: Not on file    Forced sexual activity: Not on file  Other Topics Concern  . Not on file  Social History Narrative  . Not on file    Outpatient Medications Prior to Visit  Medication Sig Dispense Refill  .  aspirin EC 81 MG tablet Take 81 mg by mouth daily.    . fluticasone (FLONASE) 50 MCG/ACT nasal spray Place 2 sprays into both nostrils daily. 16 g 6  . Multiple Vitamin (MULTI-VITAMIN) tablet Take 1 tablet by mouth daily.    . pantoprazole (PROTONIX) 40 MG tablet Take 1 tablet (40 mg total) by mouth daily. 30 tablet 1  . PARoxetine (PAXIL) 20 MG tablet Take 1 tablet (20 mg total) by mouth daily. 90 tablet 2  . valsartan-hydrochlorothiazide (DIOVAN-HCT) 160-25 MG tablet Take 1 tablet by mouth daily. 90 tablet 2  . thiamine (VITAMIN B-1) 100 MG tablet Take 1 tablet (100 mg total) by mouth daily. 90 tablet 2   No facility-administered medications prior to visit.     Allergies  Allergen Reactions  . Neurontin [Gabapentin] Swelling    Dropped BP, in hospital for 3 days.    ROS Review of Systems  Constitutional: Negative.   HENT: Negative.   Eyes: Negative for photophobia and visual disturbance.  Respiratory: Negative.   Cardiovascular: Negative.   Gastrointestinal: Negative.   Endocrine: Negative  for polyphagia and polyuria.  Genitourinary: Negative for difficulty urinating, frequency and urgency.  Musculoskeletal: Positive for myalgias.  Skin: Negative for pallor and rash.  Allergic/Immunologic: Negative for immunocompromised state.  Neurological: Negative for numbness.  Hematological: Does not bruise/bleed easily.  Psychiatric/Behavioral: Negative.       Objective:    Physical Exam  Constitutional: He is oriented to person, place, and time. He appears well-developed and well-nourished. No distress.  HENT:  Head: Normocephalic and atraumatic.  Right Ear: External ear normal.  Left Ear: External ear normal.  Mouth/Throat: Oropharynx is clear and moist. No oropharyngeal exudate.  Eyes: Pupils are equal, round, and reactive to light. Conjunctivae are normal. Right eye exhibits no discharge. Left eye exhibits no discharge. No scleral icterus.  Neck: No JVD present. No tracheal deviation present.  Cardiovascular: Normal rate, regular rhythm and normal heart sounds.  Pulmonary/Chest: Effort normal and breath sounds normal. No stridor.  Abdominal: Bowel sounds are normal.  Neurological: He is alert and oriented to person, place, and time.  Skin: Skin is warm and dry. He is not diaphoretic.  Psychiatric: He has a normal mood and affect. His behavior is normal.    BP 120/70   Pulse 75   Ht 5\' 8"  (1.727 m)   Wt 164 lb 6 oz (74.6 kg)   SpO2 95%   BMI 24.99 kg/m  Wt Readings from Last 3 Encounters:  09/13/18 164 lb 6 oz (74.6 kg)  09/07/18 164 lb (74.4 kg)  09/06/18 164 lb (74.4 kg)   BP Readings from Last 3 Encounters:  09/13/18 120/70  09/07/18 114/61  09/06/18 130/68   Guideline developer:  UpToDate (see UpToDate for funding source) Date Released: June 2014  Health Maintenance Due  Topic Date Due  . PNA vac Low Risk Adult (2 of 2 - PPSV23) 05/26/2018    There are no preventive care reminders to display for this patient.  Lab Results  Component Value Date    TSH 0.57 03/26/2017   Lab Results  Component Value Date   WBC 6.6 08/02/2018   HGB 11.4 (L) 08/02/2018   HCT 34.4 (L) 08/02/2018   MCV 90.5 08/02/2018   PLT 327 08/02/2018   Lab Results  Component Value Date   NA 141 08/02/2018   K 3.8 08/02/2018   CO2 30 08/02/2018   GLUCOSE 105 (H) 08/02/2018   BUN 15 08/02/2018  CREATININE 1.15 08/02/2018   BILITOT 0.8 08/02/2018   ALKPHOS 61 08/02/2018   AST 27 08/02/2018   ALT 16 08/02/2018   PROT 6.8 08/02/2018   ALBUMIN 4.1 08/02/2018   CALCIUM 9.1 08/02/2018   ANIONGAP 8 08/02/2018   GFR 67.21 04/05/2018   Lab Results  Component Value Date   CHOL 154 09/27/2017   Lab Results  Component Value Date   HDL 37.90 (L) 09/27/2017   Lab Results  Component Value Date   LDLCALC 91 09/27/2017   Lab Results  Component Value Date   TRIG 125.0 09/27/2017   Lab Results  Component Value Date   CHOLHDL 4 09/27/2017   No results found for: HGBA1C    Assessment & Plan:   Problem List Items Addressed This Visit      Digestive   Glossitis - Primary    Other Visit Diagnoses    Muscle cramps       Relevant Orders   Urinalysis, Routine w reflex microscopic      No orders of the defined types were placed in this encounter.   Follow-up: Return in about 3 months (around 12/14/2018).    Patient will discontinue thiamine and switch to B complex.  Hematology suggested that patient take his iron pills every other day and he is doing so.  Or his muscle cramps he will switch to B complex and continue the iron.  Checking urinalysis to check hydration status.

## 2018-09-14 LAB — URINALYSIS, ROUTINE W REFLEX MICROSCOPIC
Bilirubin Urine: NEGATIVE
Hgb urine dipstick: NEGATIVE
Ketones, ur: NEGATIVE
Leukocytes,Ua: NEGATIVE
Nitrite: NEGATIVE
Specific Gravity, Urine: >=1.03 — AB (ref 1.000–1.030)
Total Protein, Urine: NEGATIVE
Urine Glucose: NEGATIVE
Urobilinogen, UA: 0.2 (ref 0.0–1.0)
pH: 5 (ref 5.0–8.0)

## 2018-09-27 ENCOUNTER — Other Ambulatory Visit: Payer: Self-pay

## 2018-09-27 DIAGNOSIS — R8290 Unspecified abnormal findings in urine: Secondary | ICD-10-CM

## 2018-09-29 ENCOUNTER — Other Ambulatory Visit: Payer: Self-pay | Admitting: Internal Medicine

## 2018-09-29 DIAGNOSIS — K219 Gastro-esophageal reflux disease without esophagitis: Secondary | ICD-10-CM

## 2018-09-29 DIAGNOSIS — R1310 Dysphagia, unspecified: Secondary | ICD-10-CM

## 2018-09-29 DIAGNOSIS — R0989 Other specified symptoms and signs involving the circulatory and respiratory systems: Secondary | ICD-10-CM

## 2018-10-04 ENCOUNTER — Other Ambulatory Visit: Payer: Self-pay

## 2018-10-04 ENCOUNTER — Inpatient Hospital Stay: Payer: BC Managed Care – PPO | Admitting: Hematology

## 2018-10-04 ENCOUNTER — Encounter: Payer: Self-pay | Admitting: Hematology

## 2018-10-04 ENCOUNTER — Telehealth: Payer: Self-pay | Admitting: Hematology

## 2018-10-04 ENCOUNTER — Inpatient Hospital Stay: Payer: BC Managed Care – PPO | Attending: Hematology

## 2018-10-04 VITALS — BP 139/72 | HR 66 | Resp 17 | Ht 68.0 in | Wt 165.0 lb

## 2018-10-04 DIAGNOSIS — R7989 Other specified abnormal findings of blood chemistry: Secondary | ICD-10-CM | POA: Diagnosis not present

## 2018-10-04 DIAGNOSIS — D75839 Thrombocytosis, unspecified: Secondary | ICD-10-CM

## 2018-10-04 DIAGNOSIS — Z79899 Other long term (current) drug therapy: Secondary | ICD-10-CM | POA: Insufficient documentation

## 2018-10-04 DIAGNOSIS — D473 Essential (hemorrhagic) thrombocythemia: Secondary | ICD-10-CM | POA: Diagnosis not present

## 2018-10-04 DIAGNOSIS — R1310 Dysphagia, unspecified: Secondary | ICD-10-CM | POA: Diagnosis not present

## 2018-10-04 DIAGNOSIS — D649 Anemia, unspecified: Secondary | ICD-10-CM | POA: Diagnosis not present

## 2018-10-04 DIAGNOSIS — Z7982 Long term (current) use of aspirin: Secondary | ICD-10-CM | POA: Insufficient documentation

## 2018-10-04 LAB — CMP (CANCER CENTER ONLY)
ALT: 14 U/L (ref 0–44)
AST: 20 U/L (ref 15–41)
Albumin: 3.9 g/dL (ref 3.5–5.0)
Alkaline Phosphatase: 79 U/L (ref 38–126)
Anion gap: 8 (ref 5–15)
BUN: 19 mg/dL (ref 8–23)
CO2: 31 mmol/L (ref 22–32)
Calcium: 8.8 mg/dL — ABNORMAL LOW (ref 8.9–10.3)
Chloride: 100 mmol/L (ref 98–111)
Creatinine: 1.33 mg/dL — ABNORMAL HIGH (ref 0.61–1.24)
GFR, Est AFR Am: >60 mL/min (ref >60)
GFR, Estimated: 56 mL/min — ABNORMAL LOW (ref >60)
Glucose, Bld: 119 mg/dL — ABNORMAL HIGH (ref 70–99)
Potassium: 4.5 mmol/L (ref 3.5–5.1)
Sodium: 139 mmol/L (ref 135–145)
Total Bilirubin: 0.7 mg/dL (ref 0.3–1.2)
Total Protein: 6.7 g/dL (ref 6.5–8.1)

## 2018-10-04 LAB — CBC WITH DIFFERENTIAL (CANCER CENTER ONLY)
Abs Immature Granulocytes: 0.03 10*3/uL (ref 0.00–0.07)
Basophils Absolute: 0.1 10*3/uL (ref 0.0–0.1)
Basophils Relative: 1 %
Eosinophils Absolute: 0.1 10*3/uL (ref 0.0–0.5)
Eosinophils Relative: 1 %
HCT: 35.4 % — ABNORMAL LOW (ref 39.0–52.0)
Hemoglobin: 11.6 g/dL — ABNORMAL LOW (ref 13.0–17.0)
Immature Granulocytes: 0 %
Lymphocytes Relative: 25 %
Lymphs Abs: 1.9 10*3/uL (ref 0.7–4.0)
MCH: 29.7 pg (ref 26.0–34.0)
MCHC: 32.8 g/dL (ref 30.0–36.0)
MCV: 90.8 fL (ref 80.0–100.0)
Monocytes Absolute: 0.6 10*3/uL (ref 0.1–1.0)
Monocytes Relative: 8 %
Neutro Abs: 5.1 10*3/uL (ref 1.7–7.7)
Neutrophils Relative %: 65 %
Platelet Count: 401 10*3/uL — ABNORMAL HIGH (ref 150–400)
RBC: 3.9 MIL/uL — ABNORMAL LOW (ref 4.22–5.81)
RDW: 13.4 % (ref 11.5–15.5)
WBC Count: 7.9 10*3/uL (ref 4.0–10.5)
nRBC: 0 % (ref 0.0–0.2)

## 2018-10-04 LAB — SAVE SMEAR(SSMR), FOR PROVIDER SLIDE REVIEW

## 2018-10-04 NOTE — Telephone Encounter (Signed)
Called and LMVM for patient with date/time of appointments per 8/18 los

## 2018-10-04 NOTE — Progress Notes (Signed)
[facility] OFFICE PROGRESS NOTE  Patient Care Team: Libby Maw, MD as PCP - General (Family Medicine)  HEME/ONC OVERVIEW: 1. Normocytic anemia -Likely multifactorial, including mild CKD and iron deficiency; Hgb ~12   MM panel negative for any monoclonal protein; free light chain proportionately elevated 2/2 kidney disease   Colonoscopy normal in 08/2016 except a few polyps; EGD unremarkable in 08/2018   TREATMENT REGIMEN:  IV iron PRN   ASSESSMENT & PLAN:   Normocytic anemia -Likely multifactorial, including mild CKD, iron deficiency and anemia of chronic disease  -Colonoscopy in 08/2016 normal except some polyps; EGD unremarkable in 08/2018  -Currently taking oral iron supplement q2days, no significant GI side effects  -Hgb 11.6 today, mild but persistently low  -We discussed some of the risks, benefits, and alternatives of intravenous iron infusions.  -The patient is symptomatic from anemia despite oral iron supplement; he will need IV iron to higher levels of iron faster for adequate hematopoesis.  -Some of the side-effects to be expected including risks of infusion reactions, phlebitis, headaches, nausea and fatigue.   -The patient is willing to proceed. In light of the mild anemia, we will plan to administer one dose of IV iron and re-assess Hgb in ~2 months  -Goal is to keep ferritin level greater than 50. -I also counseled the patient on the importance of age-appropriate cancer screening, including colonoscopy   Thrombocytosis -Possibly reactive in the setting of iron deficiency anemia -Plts 401k, fluctuating -See the management of anemia above -We will monitor it for now  Elevated creatinine -Cr 1.33 today, electrolytes normal -Review of labs showed Cr between 1.1 and 1.3, likely some degree of mild CKD -I counseled the patient on the importance of maintaining adequate hydration and avoiding nephrotoxic medications, including  NSAIDs -Continue follow-up with PCP   Dysphagia -No evidence of obstruction on recent EGD in 08/2018, esophageal dilation done with symptomatic improvement in dysphagia  -Continue PPI and follow up with GI for further management   Orders Placed This Encounter  Procedures  . CBC with Differential (Cancer Center Only)    Standing Status:   Future    Standing Expiration Date:   11/08/2019  . CMP ([facility] only)    Standing Status:   Future    Standing Expiration Date:   11/08/2019  . Save Smear (SSMR)    Standing Status:   Future    Standing Expiration Date:   10/04/2019  . Ferritin    Standing Status:   Future    Standing Expiration Date:   11/08/2019  . Iron and TIBC    Standing Status:   Future    Standing Expiration Date:   11/08/2019  . Soluble transferrin receptor    Standing Status:   Future    Standing Expiration Date:   10/04/2019   All questions were answered. The patient knows to call the clinic with any problems, questions or concerns. No barriers to learning was detected.  A total of more than 25 minutes were spent face-to-face with the patient during this encounter and over half of that time was spent on counseling and coordination of care as outlined above.   Return in 2 months for labs and clinic follow-up.   Tish Men, MD 10/04/2018 2:00 PM  CHIEF COMPLAINT: "I am just tired"  INTERVAL HISTORY: Mr. Luke Odonnell returns to clinic for follow-up of normocytic anemia.  Patient reports that he recently underwent an EGD with dilation of the esophagus, and his dysphagia  has improved significantly since procedure.  He is now able to swallow solid food without any difficulty.  He feels fatigued constantly, and has been taking iron supplement every other day as instructed.  He denies any symptoms of GI bleeding, such as hematemesis, hematochezia, or melena.  He denies any other complaint today.  REVIEW OF SYSTEMS:   Constitutional: ( - ) fevers, ( - )  chills , ( - ) night  sweats Eyes: ( - ) blurriness of vision, ( - ) double vision, ( - ) watery eyes Ears, nose, mouth, throat, and face: ( - ) mucositis, ( - ) sore throat Respiratory: ( - ) cough, ( - ) dyspnea, ( - ) wheezes Cardiovascular: ( - ) palpitation, ( - ) chest discomfort, ( - ) lower extremity swelling Gastrointestinal:  ( - ) nausea, ( - ) heartburn, ( - ) change in bowel habits Skin: ( - ) abnormal skin rashes Lymphatics: ( - ) new lymphadenopathy, ( - ) easy bruising Neurological: ( - ) numbness, ( - ) tingling, ( - ) new weaknesses Behavioral/Psych: ( - ) mood change, ( - ) new changes  All other systems were reviewed with the patient and are negative.  I have reviewed the past medical history, past surgical history, social history and family history with the patient and they are unchanged from previous note.  ALLERGIES:  is allergic to neurontin [gabapentin].  MEDICATIONS:  Current Outpatient Medications  Medication Sig Dispense Refill  . aspirin EC 81 MG tablet Take 81 mg by mouth daily.    . fluticasone (FLONASE) 50 MCG/ACT nasal spray Place 2 sprays into both nostrils daily. 16 g 6  . Multiple Vitamin (MULTI-VITAMIN) tablet Take 1 tablet by mouth daily.    . pantoprazole (PROTONIX) 40 MG tablet Take 1 tablet (40 mg total) by mouth daily. 30 tablet 1  . PARoxetine (PAXIL) 20 MG tablet Take 1 tablet (20 mg total) by mouth daily. 90 tablet 2  . valsartan-hydrochlorothiazide (DIOVAN-HCT) 160-25 MG tablet Take 1 tablet by mouth daily. 90 tablet 2   No current facility-administered medications for this visit.     PHYSICAL EXAMINATION: ECOG PERFORMANCE STATUS: 1 - Symptomatic but completely ambulatory  Today's Vitals   10/04/18 1326  BP: 139/72  Pulse: 66  Resp: 17  Weight: 165 lb (74.8 kg)  Height: 5\' 8"  (1.727 m)   Body mass index is 25.09 kg/m.  Filed Weights   10/04/18 1326  Weight: 165 lb (74.8 kg)    GENERAL: alert, no distress and comfortable SKIN: skin color,  texture, turgor are normal, no rashes or significant lesions EYES: conjunctiva are pink and non-injected, sclera clear OROPHARYNX: no exudate, no erythema; lips, buccal mucosa, and tongue normal  NECK: supple, non-tender LYMPH:  no palpable lymphadenopathy in the cervical LUNGS: clear to auscultation with normal breathing effort HEART: regular rate & rhythm and no murmurs and no lower extremity edema ABDOMEN: soft, non-tender, non-distended, normal bowel sounds Musculoskeletal: no cyanosis of digits and no clubbing  PSYCH: alert & oriented x 3, fluent speech NEURO: no focal motor/sensory deficits  LABORATORY DATA:  I have reviewed the data as listed    Component Value Date/Time   NA 139 10/04/2018 1312   K 4.5 10/04/2018 1312   CL 100 10/04/2018 1312   CO2 31 10/04/2018 1312   GLUCOSE 119 (H) 10/04/2018 1312   BUN 19 10/04/2018 1312   CREATININE 1.33 (H) 10/04/2018 1312   CALCIUM 8.8 (L) 10/04/2018 1312  PROT 6.7 10/04/2018 1312   ALBUMIN 3.9 10/04/2018 1312   AST 20 10/04/2018 1312   ALT 14 10/04/2018 1312   ALKPHOS 79 10/04/2018 1312   BILITOT 0.7 10/04/2018 1312   GFRNONAA 56 (L) 10/04/2018 1312   GFRAA >60 10/04/2018 1312    No results found for: SPEP, UPEP  Lab Results  Component Value Date   WBC 7.9 10/04/2018   NEUTROABS 5.1 10/04/2018   HGB 11.6 (L) 10/04/2018   HCT 35.4 (L) 10/04/2018   MCV 90.8 10/04/2018   PLT 401 (H) 10/04/2018      Chemistry      Component Value Date/Time   NA 139 10/04/2018 1312   K 4.5 10/04/2018 1312   CL 100 10/04/2018 1312   CO2 31 10/04/2018 1312   BUN 19 10/04/2018 1312   CREATININE 1.33 (H) 10/04/2018 1312      Component Value Date/Time   CALCIUM 8.8 (L) 10/04/2018 1312   ALKPHOS 79 10/04/2018 1312   AST 20 10/04/2018 1312   ALT 14 10/04/2018 1312   BILITOT 0.7 10/04/2018 1312       RADIOGRAPHIC STUDIES: I have personally reviewed the radiological images as listed below and agreed with the findings in the  report. No results found.

## 2018-10-11 ENCOUNTER — Other Ambulatory Visit (INDEPENDENT_AMBULATORY_CARE_PROVIDER_SITE_OTHER): Payer: BC Managed Care – PPO

## 2018-10-11 ENCOUNTER — Other Ambulatory Visit: Payer: Self-pay

## 2018-10-11 DIAGNOSIS — R8290 Unspecified abnormal findings in urine: Secondary | ICD-10-CM | POA: Diagnosis not present

## 2018-10-11 LAB — URINALYSIS, ROUTINE W REFLEX MICROSCOPIC
Bilirubin Urine: NEGATIVE
Hgb urine dipstick: NEGATIVE
Ketones, ur: NEGATIVE
Leukocytes,Ua: NEGATIVE
Nitrite: NEGATIVE
Specific Gravity, Urine: >=1.03 — AB (ref 1.000–1.030)
Total Protein, Urine: NEGATIVE
Urine Glucose: NEGATIVE
Urobilinogen, UA: 0.2 (ref 0.0–1.0)
pH: 5.5 (ref 5.0–8.0)

## 2018-10-14 ENCOUNTER — Inpatient Hospital Stay: Payer: BC Managed Care – PPO

## 2018-10-14 ENCOUNTER — Other Ambulatory Visit: Payer: Self-pay

## 2018-10-14 VITALS — BP 125/90 | HR 65 | Temp 97.7°F | Resp 17

## 2018-10-14 DIAGNOSIS — D649 Anemia, unspecified: Secondary | ICD-10-CM | POA: Diagnosis not present

## 2018-10-14 DIAGNOSIS — R1310 Dysphagia, unspecified: Secondary | ICD-10-CM | POA: Diagnosis not present

## 2018-10-14 DIAGNOSIS — Z7982 Long term (current) use of aspirin: Secondary | ICD-10-CM | POA: Diagnosis not present

## 2018-10-14 DIAGNOSIS — Z79899 Other long term (current) drug therapy: Secondary | ICD-10-CM | POA: Diagnosis not present

## 2018-10-14 MED ORDER — SODIUM CHLORIDE 0.9 % IV SOLN
510.0000 mg | Freq: Once | INTRAVENOUS | Status: AC
  Administered 2018-10-14: 510 mg via INTRAVENOUS
  Filled 2018-10-14: qty 510

## 2018-10-14 MED ORDER — SODIUM CHLORIDE 0.9 % IV SOLN
Freq: Once | INTRAVENOUS | Status: AC
  Administered 2018-10-14: 10:00:00 via INTRAVENOUS
  Filled 2018-10-14: qty 250

## 2018-10-14 NOTE — Patient Instructions (Signed)

## 2018-12-06 ENCOUNTER — Inpatient Hospital Stay (HOSPITAL_BASED_OUTPATIENT_CLINIC_OR_DEPARTMENT_OTHER): Payer: BC Managed Care – PPO | Admitting: Hematology

## 2018-12-06 ENCOUNTER — Telehealth: Payer: Self-pay | Admitting: Hematology

## 2018-12-06 ENCOUNTER — Telehealth: Payer: Self-pay | Admitting: *Deleted

## 2018-12-06 ENCOUNTER — Other Ambulatory Visit: Payer: Self-pay

## 2018-12-06 ENCOUNTER — Encounter: Payer: Self-pay | Admitting: Hematology

## 2018-12-06 ENCOUNTER — Inpatient Hospital Stay: Payer: BC Managed Care – PPO | Attending: Hematology

## 2018-12-06 VITALS — BP 131/71 | HR 62 | Temp 97.1°F | Resp 18 | Wt 163.2 lb

## 2018-12-06 DIAGNOSIS — Z79899 Other long term (current) drug therapy: Secondary | ICD-10-CM | POA: Diagnosis not present

## 2018-12-06 DIAGNOSIS — D649 Anemia, unspecified: Secondary | ICD-10-CM | POA: Diagnosis not present

## 2018-12-06 DIAGNOSIS — N1831 Chronic kidney disease, stage 3a: Secondary | ICD-10-CM | POA: Diagnosis not present

## 2018-12-06 DIAGNOSIS — N183 Chronic kidney disease, stage 3 unspecified: Secondary | ICD-10-CM | POA: Diagnosis not present

## 2018-12-06 LAB — CMP (CANCER CENTER ONLY)
ALT: 13 U/L (ref 0–44)
AST: 19 U/L (ref 15–41)
Albumin: 4.6 g/dL (ref 3.5–5.0)
Alkaline Phosphatase: 87 U/L (ref 38–126)
Anion gap: 7 (ref 5–15)
BUN: 23 mg/dL (ref 8–23)
CO2: 33 mmol/L — ABNORMAL HIGH (ref 22–32)
Calcium: 9.5 mg/dL (ref 8.9–10.3)
Chloride: 99 mmol/L (ref 98–111)
Creatinine: 1.37 mg/dL — ABNORMAL HIGH (ref 0.61–1.24)
GFR, Est AFR Am: >60 mL/min (ref >60)
GFR, Estimated: 54 mL/min — ABNORMAL LOW (ref >60)
Glucose, Bld: 119 mg/dL — ABNORMAL HIGH (ref 70–99)
Potassium: 4 mmol/L (ref 3.5–5.1)
Sodium: 139 mmol/L (ref 135–145)
Total Bilirubin: 0.5 mg/dL (ref 0.3–1.2)
Total Protein: 7.2 g/dL (ref 6.5–8.1)

## 2018-12-06 LAB — SAVE SMEAR(SSMR), FOR PROVIDER SLIDE REVIEW

## 2018-12-06 LAB — CBC WITH DIFFERENTIAL (CANCER CENTER ONLY)
Abs Immature Granulocytes: 0.02 10*3/uL (ref 0.00–0.07)
Basophils Absolute: 0 10*3/uL (ref 0.0–0.1)
Basophils Relative: 1 %
Eosinophils Absolute: 0.1 10*3/uL (ref 0.0–0.5)
Eosinophils Relative: 2 %
HCT: 35.3 % — ABNORMAL LOW (ref 39.0–52.0)
Hemoglobin: 11.5 g/dL — ABNORMAL LOW (ref 13.0–17.0)
Immature Granulocytes: 0 %
Lymphocytes Relative: 28 %
Lymphs Abs: 1.5 10*3/uL (ref 0.7–4.0)
MCH: 29.9 pg (ref 26.0–34.0)
MCHC: 32.6 g/dL (ref 30.0–36.0)
MCV: 91.9 fL (ref 80.0–100.0)
Monocytes Absolute: 0.5 10*3/uL (ref 0.1–1.0)
Monocytes Relative: 10 %
Neutro Abs: 3.1 10*3/uL (ref 1.7–7.7)
Neutrophils Relative %: 59 %
Platelet Count: 360 10*3/uL (ref 150–400)
RBC: 3.84 MIL/uL — ABNORMAL LOW (ref 4.22–5.81)
RDW: 13.2 % (ref 11.5–15.5)
WBC Count: 5.3 10*3/uL (ref 4.0–10.5)
nRBC: 0 % (ref 0.0–0.2)

## 2018-12-06 LAB — IRON AND TIBC
Iron: 56 ug/dL (ref 42–163)
Saturation Ratios: 20 % (ref 20–55)
TIBC: 280 ug/dL (ref 202–409)
UIBC: 224 ug/dL (ref 117–376)

## 2018-12-06 LAB — FERRITIN: Ferritin: 354 ng/mL — ABNORMAL HIGH (ref 24–336)

## 2018-12-06 NOTE — Progress Notes (Signed)
[facility] OFFICE PROGRESS NOTE  Patient Care Team: Libby Maw, MD as PCP - General (Family Medicine)  HEME/ONC OVERVIEW: 1. Normocytic anemia -Most likely secondary to CKD; Hgb mid-11's   MM panel negative for any monoclonal protein; free light chain proportionately elevated 2/2 kidney disease   Colonoscopy normal in 08/2016 except a few polyps; EGD unremarkable in 08/2018   No improvement with IV iron   TREATMENT REGIMEN:  IV iron PRN, last in 09/2018   ASSESSMENT & PLAN:   Normocytic anemia -Most likely secondary to CKD  -No improvement with IV iron, making iron deficiency less likely  -Colonoscopy in 08/2016 normal except some polyps; EGD unremarkable in 08/2018  -Hgb 11.5 today, stable  -As he did not have any symptomatic improvement or rise in Hgb, I do not think repeat IV iron would be beneficial -We discussed in detail regarding the management of anemia secondary to chronic kidney disease, including erythropoietin-stimulating agent and some of this potential side effects -Due to his Hgb > 11, there is no indication for initiating ESA at this time -If his iron profile does not show any significant iron deficiency, then he does not have to resume oral iron supplement -I also counseled the patient on the importance of age-appropriate cancer screening, including colonoscopy    Stage III CKD -Possibly due to long-standing HTN  -Cr 1.37 today, stable; electrolytes normal -See discussion regarding anemia secondary to CKD above -I have referred the patient to nephrology for further evaluation and management -I also counseled the patient on the importance of avoiding nephrotoxic medications, including NSAIDs  Orders Placed This Encounter  Procedures  . CBC with Differential (Cancer Center Only)    Standing Status:   Future    Standing Expiration Date:   01/10/2020  . CMP ([facility] only)    Standing Status:   Future    Standing Expiration  Date:   01/10/2020  . Ferritin    Standing Status:   Future    Standing Expiration Date:   01/10/2020  . Iron and TIBC    Standing Status:   Future    Standing Expiration Date:   01/10/2020  . Ambulatory referral to Nephrology    Referral Priority:   Routine    Referral Type:   Consultation    Referral Reason:   Specialty Services Required    Requested Specialty:   Nephrology    Number of Visits Requested:   1   All questions were answered. The patient knows to call the clinic with any problems, questions or concerns. No barriers to learning was detected.  A total of more than 25 minutes were spent face-to-face with the patient during this encounter and over half of that time was spent on counseling and coordination of care as outlined above.   Return in 6 months for labs and clinic follow-up.   Tish Men, MD 12/06/2018 11:37 AM  CHIEF COMPLAINT: "I am doing pretty good"  INTERVAL HISTORY: Luke Odonnell returns clinic for follow-up of normocytic anemia.  He tolerated IV iron infusion well in 09/2018, but did not feel any symptomatic improvement after the infusion.  Since his recent endoscopy with esophageal dilation, his dysphagia has resolved, and he is able to eat and drink without any limitation.  He had his last colonoscopy in 2018 at [facility], which did not show any signs of bleeding.  He is otherwise doing well, and denies any complaint today.  REVIEW OF SYSTEMS:  Constitutional: ( - ) fevers, ( - )  chills , ( - ) night sweats Eyes: ( - ) blurriness of vision, ( - ) double vision, ( - ) watery eyes Ears, nose, mouth, throat, and face: ( - ) mucositis, ( - ) sore throat Respiratory: ( - ) cough, ( - ) dyspnea, ( - ) wheezes Cardiovascular: ( - ) palpitation, ( - ) chest discomfort, ( - ) lower extremity swelling Gastrointestinal:  ( - ) nausea, ( - ) heartburn, ( - ) change in bowel habits Skin: ( - ) abnormal skin rashes Lymphatics: ( - ) new  lymphadenopathy, ( - ) easy bruising Neurological: ( - ) numbness, ( - ) tingling, ( - ) new weaknesses Behavioral/Psych: ( - ) mood change, ( - ) new changes  All other systems were reviewed with the patient and are negative.  SUMMARY OF ONCOLOGIC HISTORY: Oncology History   No history exists.    I have reviewed the past medical history, past surgical history, social history and family history with the patient and they are unchanged from previous note.  ALLERGIES:  is allergic to neurontin [gabapentin].  MEDICATIONS:  Current Outpatient Medications  Medication Sig Dispense Refill  . aspirin EC 81 MG tablet Take 81 mg by mouth daily.    . fluticasone (FLONASE) 50 MCG/ACT nasal spray Place 2 sprays into both nostrils daily. 16 g 6  . Multiple Vitamin (MULTI-VITAMIN) tablet Take 1 tablet by mouth daily.    Marland Kitchen PARoxetine (PAXIL) 20 MG tablet Take 1 tablet (20 mg total) by mouth daily. 90 tablet 2  . valsartan-hydrochlorothiazide (DIOVAN-HCT) 160-25 MG tablet Take 1 tablet by mouth daily. 90 tablet 2  . pantoprazole (PROTONIX) 40 MG tablet Take 1 tablet (40 mg total) by mouth daily. 30 tablet 1   No current facility-administered medications for this visit.     PHYSICAL EXAMINATION: ECOG PERFORMANCE STATUS: 1 - Symptomatic but completely ambulatory  Today's Vitals   12/06/18 1059 12/06/18 1100  BP: 131/71   Pulse: 62   Resp: 18   Temp: (!) 97.1 F (36.2 C)   TempSrc: Temporal   SpO2: 98%   Weight: 163 lb 4 oz (74 kg)   PainSc:  0-No pain   Body mass index is 24.82 kg/m.  Filed Weights   12/06/18 1059  Weight: 163 lb 4 oz (74 kg)    GENERAL: alert, no distress and comfortable SKIN: skin color, texture, turgor are normal, no rashes or significant lesions EYES: conjunctiva are pink and non-injected, sclera clear OROPHARYNX: no exudate, no erythema; lips, buccal mucosa, and tongue normal  NECK: supple, non-tender LUNGS: clear to auscultation with normal breathing  effort HEART: regular rate & rhythm and no murmurs and no lower extremity edema ABDOMEN: soft, non-tender, non-distended, normal bowel sounds Musculoskeletal: no cyanosis of digits and no clubbing  PSYCH: alert & oriented x 3, fluent speech NEURO: no focal motor/sensory deficits  LABORATORY DATA:  I have reviewed the data as listed    Component Value Date/Time   NA 139 12/06/2018 1032   K 4.0 12/06/2018 1032   CL 99 12/06/2018 1032   CO2 33 (H) 12/06/2018 1032   GLUCOSE 119 (H) 12/06/2018 1032   BUN 23 12/06/2018 1032   CREATININE 1.37 (H) 12/06/2018 1032   CALCIUM 9.5 12/06/2018 1032   PROT 7.2 12/06/2018 1032   ALBUMIN 4.6 12/06/2018 1032   AST 19 12/06/2018 1032   ALT 13 12/06/2018 1032   ALKPHOS 87 12/06/2018  1032   BILITOT 0.5 12/06/2018 1032   GFRNONAA 54 (L) 12/06/2018 1032   GFRAA >60 12/06/2018 1032    No results found for: SPEP, UPEP  Lab Results  Component Value Date   WBC 5.3 12/06/2018   NEUTROABS 3.1 12/06/2018   HGB 11.5 (L) 12/06/2018   HCT 35.3 (L) 12/06/2018   MCV 91.9 12/06/2018   PLT 360 12/06/2018      Chemistry      Component Value Date/Time   NA 139 12/06/2018 1032   K 4.0 12/06/2018 1032   CL 99 12/06/2018 1032   CO2 33 (H) 12/06/2018 1032   BUN 23 12/06/2018 1032   CREATININE 1.37 (H) 12/06/2018 1032      Component Value Date/Time   CALCIUM 9.5 12/06/2018 1032   ALKPHOS 87 12/06/2018 1032   AST 19 12/06/2018 1032   ALT 13 12/06/2018 1032   BILITOT 0.5 12/06/2018 1032       RADIOGRAPHIC STUDIES: I have personally reviewed the radiological images as listed below and agreed with the findings in the report. No results found.

## 2018-12-06 NOTE — Telephone Encounter (Signed)
Appointments scheduled calendar printed/ Nephrology referral has been placed to [facility].  They will contact patient to schedule appointment

## 2018-12-06 NOTE — Telephone Encounter (Addendum)
-----   Message from Tish Men, MD sent   Luke Odonnell notified  that his iron profile is good, so he doesn't need any more IV iron at this time? Patient appreciates information  [name]  ----- Message ----- From: Buel Ream, Lab In [place] Sent: 12/06/2018  10:45 AM EDT To: Tish Men, MD

## 2018-12-07 LAB — SOLUBLE TRANSFERRIN RECEPTOR: Transferrin Receptor: 11.4 nmol/L — ABNORMAL LOW (ref 12.2–27.3)

## 2019-04-27 ENCOUNTER — Other Ambulatory Visit: Payer: Self-pay | Admitting: Family Medicine

## 2019-04-27 DIAGNOSIS — F325 Major depressive disorder, single episode, in full remission: Secondary | ICD-10-CM

## 2019-06-06 ENCOUNTER — Inpatient Hospital Stay: Payer: BC Managed Care – PPO | Admitting: Hematology

## 2019-06-06 ENCOUNTER — Inpatient Hospital Stay: Payer: BC Managed Care – PPO | Attending: Hematology

## 2019-08-11 ENCOUNTER — Other Ambulatory Visit: Payer: Self-pay | Admitting: Family Medicine

## 2019-08-11 DIAGNOSIS — I10 Essential (primary) hypertension: Secondary | ICD-10-CM

## 2019-11-04 ENCOUNTER — Other Ambulatory Visit: Payer: Self-pay | Admitting: Family Medicine

## 2019-11-04 DIAGNOSIS — I10 Essential (primary) hypertension: Secondary | ICD-10-CM

## 2019-11-08 NOTE — Telephone Encounter (Signed)
Appointment scheduled.

## 2019-11-23 ENCOUNTER — Other Ambulatory Visit: Payer: Self-pay

## 2019-11-24 ENCOUNTER — Encounter: Payer: Self-pay | Admitting: Family Medicine

## 2019-11-24 ENCOUNTER — Ambulatory Visit: Payer: BC Managed Care – PPO | Admitting: Family Medicine

## 2019-11-24 VITALS — BP 138/62 | HR 64 | Temp 98.6°F | Ht 68.0 in | Wt 164.8 lb

## 2019-11-24 DIAGNOSIS — I10 Essential (primary) hypertension: Secondary | ICD-10-CM

## 2019-11-24 DIAGNOSIS — F325 Major depressive disorder, single episode, in full remission: Secondary | ICD-10-CM

## 2019-11-24 DIAGNOSIS — F339 Major depressive disorder, recurrent, unspecified: Secondary | ICD-10-CM

## 2019-11-24 DIAGNOSIS — E519 Thiamine deficiency, unspecified: Secondary | ICD-10-CM

## 2019-11-24 DIAGNOSIS — E538 Deficiency of other specified B group vitamins: Secondary | ICD-10-CM | POA: Insufficient documentation

## 2019-11-24 DIAGNOSIS — E611 Iron deficiency: Secondary | ICD-10-CM | POA: Diagnosis not present

## 2019-11-24 DIAGNOSIS — Z0000 Encounter for general adult medical examination without abnormal findings: Secondary | ICD-10-CM | POA: Insufficient documentation

## 2019-11-24 DIAGNOSIS — D649 Anemia, unspecified: Secondary | ICD-10-CM | POA: Diagnosis not present

## 2019-11-24 MED ORDER — VALSARTAN-HYDROCHLOROTHIAZIDE 160-25 MG PO TABS: 1.0000 | ORAL_TABLET | Freq: Every day | ORAL | 3 refills | Status: DC

## 2019-11-24 MED ORDER — PAROXETINE HCL 20 MG PO TABS: 20.0000 mg | ORAL_TABLET | Freq: Every day | ORAL | 2 refills | Status: DC

## 2019-11-24 NOTE — Progress Notes (Signed)
Established Patient Office Visit  Subjective:  Patient ID: Luke Odonnell, male    DOB: [birth year]  Age: 64 y.o. MRN: [id number]  CC:  Chief Complaint  Patient presents with  . Follow-up    medications-blood pressure    HPI HARSHAAN WHANG presents for follow-up of hypertension, B1, B12 deficiencies, depression and iron deficiency.  Continues taking B complex 3-5 times weekly.  Blood pressures well controlled with Diovan HCT.  Continues with Paxil for depression.  It has been well controlled.  Past Medical History:  Diagnosis Date  . Abnormal SPEP   . Adenomatous polyp of colon 2003  . Decreased sex drive   . Depression   . Dysgeusia   . Hiatal hernia   . Hypertension   . Iron deficiency anemia   . Thiamine deficiency     Past Surgical History:  Procedure Laterality Date  . BACK SURGERY     x 4: neck and lower back fusions    Family History  Problem Relation Age of Onset  . Colonic polyp Father   . Stroke Father   . Esophageal cancer Neg Hx   . Colon cancer Neg Hx   . Rectal cancer Neg Hx   . Stomach cancer Neg Hx     Social History   Socioeconomic History  . Marital status: Married    Spouse name: Not on file  . Number of children: Not on file  . Years of education: Not on file  . Highest education level: Not on file  Occupational History  . Not on file  Tobacco Use  . Smoking status: Never Smoker  . Smokeless tobacco: Never Used  Vaping Use  . Vaping Use: Never used  Substance and Sexual Activity  . Alcohol use: Yes    Alcohol/week: 1.0 standard drink    Types: 1 Cans of beer per week  . Drug use: Not Currently  . Sexual activity: Not on file  Other Topics Concern  . Not on file  Social History Narrative  . Not on file   Social Determinants of Health   Financial Resource Strain:   . Difficulty of Paying Living Expenses: Not on file  Food Insecurity:   . Worried About Charity fundraiser in the Last Year: Not on file  . Ran Out of Food in the  Last Year: Not on file  Transportation Needs:   . Lack of Transportation (Medical): Not on file  . Lack of Transportation (Non-Medical): Not on file  Physical Activity:   . Days of Exercise per Week: Not on file  . Minutes of Exercise per Session: Not on file  Stress:   . Feeling of Stress : Not on file  Social Connections:   . Frequency of Communication with Friends and Family: Not on file  . Frequency of Social Gatherings with Friends and Family: Not on file  . Attends Religious Services: Not on file  . Active Member of Clubs or Organizations: Not on file  . Attends Archivist Meetings: Not on file  . Marital Status: Not on file  Intimate Partner Violence:   . Fear of Current or Ex-Partner: Not on file  . Emotionally Abused: Not on file  . Physically Abused: Not on file  . Sexually Abused: Not on file    Outpatient Medications Prior to Visit  Medication Sig Dispense Refill  . aspirin EC 81 MG tablet Take 81 mg by mouth daily.    . fluticasone (FLONASE) 50  MCG/ACT nasal spray Place 2 sprays into both nostrils daily. 16 g 6  . Multiple Vitamin (MULTI-VITAMIN) tablet Take 1 tablet by mouth daily.    Marland Kitchen PARoxetine (PAXIL) 20 MG tablet TAKE 1 TABLET BY MOUTH EVERY DAY 90 tablet 2  . valsartan-hydrochlorothiazide (DIOVAN-HCT) 160-25 MG tablet TAKE 1 TABLET BY MOUTH EVERY DAY 90 tablet 0  . pantoprazole (PROTONIX) 40 MG tablet Take 1 tablet (40 mg total) by mouth daily. (Patient not taking: Reported on 11/24/2019) 30 tablet 1   No facility-administered medications prior to visit.    Allergies  Allergen Reactions  . Neurontin [Gabapentin] Swelling    Dropped BP, in hospital for 3 days.    ROS Review of Systems  Constitutional: Negative.   HENT: Negative.   Eyes: Negative for photophobia and visual disturbance.  Respiratory: Negative for chest tightness, shortness of breath and wheezing.   Cardiovascular: Negative.   Gastrointestinal: Negative.   Endocrine: Negative  for polyphagia and polyuria.  Genitourinary: Negative.   Musculoskeletal: Negative for gait problem and joint swelling.  Skin: Negative for pallor and rash.  Allergic/Immunologic: Negative for immunocompromised state.  Neurological: Negative for tremors and speech difficulty.  Hematological: Does not bruise/bleed easily.  Psychiatric/Behavioral: Negative.       Objective:    Physical Exam Vitals and nursing note reviewed.  Constitutional:      General: He is not in acute distress.    Appearance: Normal appearance. He is normal weight. He is not ill-appearing, toxic-appearing or diaphoretic.  HENT:     Head: Normocephalic and atraumatic.     Right Ear: External ear normal.     Left Ear: External ear normal.  Eyes:     General: No scleral icterus.       Right eye: No discharge.        Left eye: No discharge.     Conjunctiva/sclera: Conjunctivae normal.  Cardiovascular:     Rate and Rhythm: Normal rate and regular rhythm.  Pulmonary:     Effort: Pulmonary effort is normal.     Breath sounds: Normal breath sounds.  Abdominal:     General: Bowel sounds are normal.  Musculoskeletal:     Cervical back: No tenderness.  Lymphadenopathy:     Cervical: No cervical adenopathy.  Skin:    General: Skin is warm and dry.  Neurological:     Mental Status: He is alert and oriented to person, place, and time.  Psychiatric:        Mood and Affect: Mood normal.        Behavior: Behavior normal.     BP 138/62   Pulse 64   Temp 98.6 F (37 C) (Temporal)   Ht 5\' 8"  (1.727 m)   Wt 164 lb 12.8 oz (74.8 kg)   SpO2 96%   BMI 25.06 kg/m  Wt Readings from Last 3 Encounters:  11/24/19 164 lb 12.8 oz (74.8 kg)  12/06/18 163 lb 4 oz (74 kg)  10/04/18 165 lb (74.8 kg)     Health Maintenance Due  Topic Date Due  . COVID-19 Vaccine (1) Never done  . PNA vac Low Risk Adult (2 of 2 - PPSV23) 05/26/2018  . INFLUENZA VACCINE  Never done    There are no preventive care reminders to  display for this patient.  Lab Results  Component Value Date   TSH 0.57 03/26/2017   Lab Results  Component Value Date   WBC 5.3 12/06/2018   HGB 11.5 (L) 12/06/2018  HCT 35.3 (L) 12/06/2018   MCV 91.9 12/06/2018   PLT 360 12/06/2018   Lab Results  Component Value Date   NA 139 12/06/2018   K 4.0 12/06/2018   CO2 33 (H) 12/06/2018   GLUCOSE 119 (H) 12/06/2018   BUN 23 12/06/2018   CREATININE 1.37 (H) 12/06/2018   BILITOT 0.5 12/06/2018   ALKPHOS 87 12/06/2018   AST 19 12/06/2018   ALT 13 12/06/2018   PROT 7.2 12/06/2018   ALBUMIN 4.6 12/06/2018   CALCIUM 9.5 12/06/2018   ANIONGAP 7 12/06/2018   GFR 67.21 04/05/2018   Lab Results  Component Value Date   CHOL 154 09/27/2017   Lab Results  Component Value Date   HDL 37.90 (L) 09/27/2017   Lab Results  Component Value Date   LDLCALC 91 09/27/2017   Lab Results  Component Value Date   TRIG 125.0 09/27/2017   Lab Results  Component Value Date   CHOLHDL 4 09/27/2017   No results found for: HGBA1C    Assessment & Plan:   Problem List Items Addressed This Visit      Cardiovascular and Mediastinum   Essential hypertension   Relevant Medications   valsartan-hydrochlorothiazide (DIOVAN-HCT) 160-25 MG tablet   Other Relevant Orders   CBC   Comprehensive metabolic panel   Urinalysis, Routine w reflex microscopic     Other   Depression, recurrent (HCC)   Relevant Medications   PARoxetine (PAXIL) 20 MG tablet   Thiamine deficiency   Relevant Orders   Vitamin B1   Anemia   Relevant Orders   CBC   Iron deficiency - Primary   Relevant Orders   Iron, TIBC and Ferritin Panel   B12 deficiency   Relevant Orders   Vitamin B12   Healthcare maintenance   Relevant Orders   Lipid panel   PSA    Other Visit Diagnoses    Depression, major, single episode, complete remission ([place])       Relevant Medications   PARoxetine (PAXIL) 20 MG tablet      Meds ordered this encounter  Medications  . PARoxetine  (PAXIL) 20 MG tablet    Sig: Take 1 tablet (20 mg total) by mouth daily.    Dispense:  90 tablet    Refill:  2  . valsartan-hydrochlorothiazide (DIOVAN-HCT) 160-25 MG tablet    Sig: Take 1 tablet by mouth daily.    Dispense:  90 tablet    Refill:  3    Follow-up: Return in about 6 months (around 05/24/2020).   Will return fasting for blood work. Libby Maw, MD

## 2019-11-28 ENCOUNTER — Other Ambulatory Visit: Payer: Self-pay

## 2019-11-29 ENCOUNTER — Other Ambulatory Visit (INDEPENDENT_AMBULATORY_CARE_PROVIDER_SITE_OTHER): Payer: BC Managed Care – PPO

## 2019-11-29 DIAGNOSIS — I10 Essential (primary) hypertension: Secondary | ICD-10-CM

## 2019-11-29 DIAGNOSIS — E519 Thiamine deficiency, unspecified: Secondary | ICD-10-CM

## 2019-11-29 DIAGNOSIS — E538 Deficiency of other specified B group vitamins: Secondary | ICD-10-CM | POA: Diagnosis not present

## 2019-11-29 DIAGNOSIS — Z0000 Encounter for general adult medical examination without abnormal findings: Secondary | ICD-10-CM

## 2019-11-29 DIAGNOSIS — D649 Anemia, unspecified: Secondary | ICD-10-CM | POA: Diagnosis not present

## 2019-11-29 DIAGNOSIS — E611 Iron deficiency: Secondary | ICD-10-CM | POA: Diagnosis not present

## 2019-11-29 LAB — COMPREHENSIVE METABOLIC PANEL
ALT: 16 U/L (ref 0–53)
AST: 20 U/L (ref 0–37)
Albumin: 4.3 g/dL (ref 3.5–5.2)
Alkaline Phosphatase: 82 U/L (ref 39–117)
BUN: 20 mg/dL (ref 6–23)
CO2: 30 mEq/L (ref 19–32)
Calcium: 9.5 mg/dL (ref 8.4–10.5)
Chloride: 98 mEq/L (ref 96–112)
Creatinine, Ser: 1.36 mg/dL (ref 0.40–1.50)
GFR: 53.65 mL/min — ABNORMAL LOW (ref >60.00)
Glucose, Bld: 99 mg/dL (ref 70–99)
Potassium: 3.8 mEq/L (ref 3.5–5.1)
Sodium: 137 mEq/L (ref 135–145)
Total Bilirubin: 1 mg/dL (ref 0.2–1.2)
Total Protein: 7.4 g/dL (ref 6.0–8.3)

## 2019-11-29 LAB — LIPID PANEL
Cholesterol: 190 mg/dL (ref 0–200)
HDL: 46.7 mg/dL (ref >39.00)
LDL Cholesterol: 130 mg/dL — ABNORMAL HIGH (ref 0–99)
NonHDL: 142.86
Total CHOL/HDL Ratio: 4
Triglycerides: 62 mg/dL (ref 0.0–149.0)
VLDL: 12.4 mg/dL (ref 0.0–40.0)

## 2019-11-29 LAB — PSA: PSA: 0.17 ng/mL (ref 0.10–4.00)

## 2019-11-29 LAB — URINALYSIS, ROUTINE W REFLEX MICROSCOPIC
Bilirubin Urine: NEGATIVE
Hgb urine dipstick: NEGATIVE
Ketones, ur: NEGATIVE
Leukocytes,Ua: NEGATIVE
Nitrite: NEGATIVE
Specific Gravity, Urine: 1.025 (ref 1.000–1.030)
Total Protein, Urine: NEGATIVE
Urine Glucose: NEGATIVE
Urobilinogen, UA: 0.2 (ref 0.0–1.0)
pH: 5.5 (ref 5.0–8.0)

## 2019-11-29 LAB — CBC
HCT: 37.1 % — ABNORMAL LOW (ref 39.0–52.0)
Hemoglobin: 12.6 g/dL — ABNORMAL LOW (ref 13.0–17.0)
MCHC: 33.9 g/dL (ref 30.0–36.0)
MCV: 90.7 fl (ref 78.0–100.0)
Platelets: 365 10*3/uL (ref 150.0–400.0)
RBC: 4.09 Mil/uL — ABNORMAL LOW (ref 4.22–5.81)
RDW: 13.9 % (ref 11.5–15.5)
WBC: 6.5 10*3/uL (ref 4.0–10.5)

## 2019-11-29 LAB — VITAMIN B12: Vitamin B-12: >1526 pg/mL — ABNORMAL HIGH (ref 211–911)

## 2019-12-04 LAB — VITAMIN B1: Vitamin B1 (Thiamine): 8 nmol/L (ref 8–30)

## 2019-12-04 LAB — IRON,TIBC AND FERRITIN PANEL
%SAT: 24 % (calc) (ref 20–48)
Ferritin: 236 ng/mL (ref 24–380)
Iron: 77 ug/dL (ref 50–180)
TIBC: 327 mcg/dL (calc) (ref 250–425)

## 2020-02-21 ENCOUNTER — Telehealth (INDEPENDENT_AMBULATORY_CARE_PROVIDER_SITE_OTHER): Payer: BC Managed Care – PPO | Admitting: Family Medicine

## 2020-02-21 ENCOUNTER — Other Ambulatory Visit: Payer: Self-pay

## 2020-02-21 DIAGNOSIS — Z91199 Patient's noncompliance with other medical treatment and regimen due to unspecified reason: Secondary | ICD-10-CM

## 2020-02-21 DIAGNOSIS — Z5329 Procedure and treatment not carried out because of patient's decision for other reasons: Secondary | ICD-10-CM

## 2020-02-21 NOTE — Progress Notes (Signed)
[facility] [address] [place], [place] [address] [phone]  Date:  02/21/2020   Name:  Luke Odonnell   DOB:  25-Jan-1956   MRN:  [id number]  PCP:  Mliss Sax, MD    Chief Complaint: No chief complaint on file.   History of Present Illness:  Luke Odonnell is a 65 y.o. very pleasant male patient who presents with the following:  Virtual visit today for patient with concern of a rash on his tongue,?  Thrush History of hypertension, B1, B12 deficiencies, depression and iron deficiency.   I have not seen this patient myself previously  Most recent visit with his PCP, Dr. Doreene Burke in October  We called pt x3 and left messages- no answer      Patient Active Problem List   Diagnosis Date Noted  . B12 deficiency 11/24/2019  . Healthcare maintenance 11/24/2019  . Dysphagia 09/06/2018  . Globus sensation 09/06/2018  . Gastroesophageal reflux disease 09/06/2018  . CKD (chronic kidney disease), stage III 08/02/2018  . Thiamine deficiency 06/24/2018  . Anemia 06/24/2018  . Fatigue 06/24/2018  . Decreased sex drive 01/60/1093  . Iron deficiency 06/24/2018  . Depression, recurrent (HCC) 04/05/2018  . Glossitis 02/07/2018  . Dysgeusia 02/07/2018  . Bronchitis 03/18/2017  . Nasal congestion 03/18/2017  . Essential hypertension 03/18/2017  . Chronic midline low back pain without sciatica 03/18/2017    Past Medical History:  Diagnosis Date  . Abnormal SPEP   . Adenomatous polyp of colon 2003  . Decreased sex drive   . Depression   . Dysgeusia   . Hiatal hernia   . Hypertension   . Iron deficiency anemia   . Thiamine deficiency     Past Surgical History:  Procedure Laterality Date  . BACK SURGERY     x 4: neck and lower back fusions    Social History   Tobacco Use  . Smoking status: Never Smoker  . Smokeless tobacco: Never Used  Vaping Use  . Vaping Use: Never used  Substance Use Topics   . Alcohol use: Yes    Alcohol/week: 1.0 standard drink    Types: 1 Cans of beer per week  . Drug use: Not Currently    Family History  Problem Relation Age of Onset  . Colonic polyp Father   . Stroke Father   . Esophageal cancer Neg Hx   . Colon cancer Neg Hx   . Rectal cancer Neg Hx   . Stomach cancer Neg Hx     Allergies  Allergen Reactions  . Neurontin [Gabapentin] Swelling    Dropped BP, in hospital for 3 days.    Medication list has been reviewed and updated.  Current Outpatient Medications on File Prior to Visit  Medication Sig Dispense Refill  . aspirin EC 81 MG tablet Take 81 mg by mouth daily.    . fluticasone (FLONASE) 50 MCG/ACT nasal spray Place 2 sprays into both nostrils daily. 16 g 6  . Multiple Vitamin (MULTI-VITAMIN) tablet Take 1 tablet by mouth daily.    Marland Kitchen PARoxetine (PAXIL) 20 MG tablet Take 1 tablet (20 mg total) by mouth daily. 90 tablet 2  . valsartan-hydrochlorothiazide (DIOVAN-HCT) 160-25 MG tablet Take 1 tablet by mouth daily. 90 tablet 3   No current facility-administered medications on file prior to visit.    Review of Systems:  As per HPI- otherwise negative.   Physical Examination: There were  no vitals filed for this visit. There were no vitals filed for this visit. There is no height or weight on file to calculate BMI. Ideal Body Weight:      Assessment and Plan: No show   Signed Lamar Blinks, MD

## 2020-05-24 ENCOUNTER — Other Ambulatory Visit: Payer: Self-pay

## 2020-05-27 ENCOUNTER — Other Ambulatory Visit: Payer: Self-pay

## 2020-05-27 ENCOUNTER — Encounter: Payer: Self-pay | Admitting: Family Medicine

## 2020-05-27 ENCOUNTER — Ambulatory Visit: Payer: BC Managed Care – PPO | Admitting: Family Medicine

## 2020-05-27 ENCOUNTER — Ambulatory Visit (INDEPENDENT_AMBULATORY_CARE_PROVIDER_SITE_OTHER): Payer: BC Managed Care – PPO

## 2020-05-27 VITALS — BP 140/82 | HR 78 | Temp 97.0°F | Ht 68.0 in | Wt 168.6 lb

## 2020-05-27 DIAGNOSIS — M47812 Spondylosis without myelopathy or radiculopathy, cervical region: Secondary | ICD-10-CM | POA: Diagnosis not present

## 2020-05-27 DIAGNOSIS — Z981 Arthrodesis status: Secondary | ICD-10-CM | POA: Diagnosis not present

## 2020-05-27 DIAGNOSIS — I10 Essential (primary) hypertension: Secondary | ICD-10-CM | POA: Diagnosis not present

## 2020-05-27 DIAGNOSIS — D649 Anemia, unspecified: Secondary | ICD-10-CM | POA: Diagnosis not present

## 2020-05-27 DIAGNOSIS — M5033 Other cervical disc degeneration, cervicothoracic region: Secondary | ICD-10-CM | POA: Diagnosis not present

## 2020-05-27 DIAGNOSIS — E611 Iron deficiency: Secondary | ICD-10-CM | POA: Diagnosis not present

## 2020-05-27 DIAGNOSIS — M2480 Other specific joint derangements of unspecified joint, not elsewhere classified: Secondary | ICD-10-CM

## 2020-05-27 DIAGNOSIS — E519 Thiamine deficiency, unspecified: Secondary | ICD-10-CM | POA: Diagnosis not present

## 2020-05-27 DIAGNOSIS — R0981 Nasal congestion: Secondary | ICD-10-CM

## 2020-05-27 DIAGNOSIS — F339 Major depressive disorder, recurrent, unspecified: Secondary | ICD-10-CM

## 2020-05-27 DIAGNOSIS — E538 Deficiency of other specified B group vitamins: Secondary | ICD-10-CM

## 2020-05-27 DIAGNOSIS — M4312 Spondylolisthesis, cervical region: Secondary | ICD-10-CM | POA: Diagnosis not present

## 2020-05-27 DIAGNOSIS — F325 Major depressive disorder, single episode, in full remission: Secondary | ICD-10-CM

## 2020-05-27 MED ORDER — FLUTICASONE PROPIONATE 50 MCG/ACT NA SUSP: 2.0000 | Freq: Every day | NASAL | 6 refills | Status: DC

## 2020-05-27 NOTE — Progress Notes (Signed)
Established Patient Office Visit  Subjective:  Patient ID: Luke Odonnell, male    DOB: [birth year]  Age: 65 y.o. MRN: [id number]  CC:  Chief Complaint  Patient presents with  . Follow-up    6 month follow up, patient states that his neck seems to be cracking frequent. Would like a xray of his neck.     HPI Luke Odonnell presents for follow-up of hypertension, depression, anemia, iron deficiency, B1 deficiency, B12 deficiency, and cervical crepitus.  History of multiple cervical surgeries.  Patient is recently noticed increased crepitus with range of motion.  Patient is feeling fatigued with minimal exertion.  He is working 2 jobs.  It has been sometime since his last iron infusion.  He is worried about return of iron deficiency.  Continues with low-dose naltrexone for chronic back and neck pain.  Past Medical History:  Diagnosis Date  . Abnormal SPEP   . Adenomatous polyp of colon 2003  . Decreased sex drive   . Depression   . Dysgeusia   . Hiatal hernia   . Hypertension   . Iron deficiency anemia   . Thiamine deficiency     Past Surgical History:  Procedure Laterality Date  . BACK SURGERY     x 4: neck and lower back fusions    Family History  Problem Relation Age of Onset  . Colonic polyp Father   . Stroke Father   . Esophageal cancer Neg Hx   . Colon cancer Neg Hx   . Rectal cancer Neg Hx   . Stomach cancer Neg Hx     Social History   Socioeconomic History  . Marital status: Married    Spouse name: Not on file  . Number of children: Not on file  . Years of education: Not on file  . Highest education level: Not on file  Occupational History  . Not on file  Tobacco Use  . Smoking status: Never Smoker  . Smokeless tobacco: Never Used  Vaping Use  . Vaping Use: Never used  Substance and Sexual Activity  . Alcohol use: Yes    Alcohol/week: 1.0 standard drink    Types: 1 Cans of beer per week  . Drug use: Not Currently  . Sexual activity: Not on file  Other  Topics Concern  . Not on file  Social History Narrative  . Not on file   Social Determinants of Health   Financial Resource Strain: Not on file  Food Insecurity: Not on file  Transportation Needs: Not on file  Physical Activity: Not on file  Stress: Not on file  Social Connections: Not on file  Intimate Partner Violence: Not on file    Outpatient Medications Prior to Visit  Medication Sig Dispense Refill  . aspirin EC 81 MG tablet Take 81 mg by mouth daily.    . fluticasone (FLONASE) 50 MCG/ACT nasal spray Place 2 sprays into both nostrils daily. 16 g 6  . Multiple Vitamin (MULTI-VITAMIN) tablet Take 1 tablet by mouth daily.    Marland Kitchen PARoxetine (PAXIL) 20 MG tablet Take 1 tablet (20 mg total) by mouth daily. 90 tablet 2  . valsartan-hydrochlorothiazide (DIOVAN-HCT) 160-25 MG tablet Take 1 tablet by mouth daily. 90 tablet 3   No facility-administered medications prior to visit.    Allergies  Allergen Reactions  . Neurontin [Gabapentin] Swelling    Dropped BP, in hospital for 3 days.    ROS Review of Systems  Constitutional: Negative.   HENT: Negative.  Eyes: Negative for photophobia and visual disturbance.  Respiratory: Negative.  Negative for chest tightness and shortness of breath.   Cardiovascular: Negative.  Negative for chest pain and palpitations.  Gastrointestinal: Negative.  Negative for nausea.  Endocrine: Negative for polyphagia and polyuria.  Genitourinary: Negative.   Musculoskeletal: Positive for back pain and neck pain.  Neurological: Negative for weakness and numbness.  Psychiatric/Behavioral: Negative.       Objective:    Physical Exam Vitals and nursing note reviewed.  Constitutional:      General: He is not in acute distress.    Appearance: Normal appearance. He is not ill-appearing, toxic-appearing or diaphoretic.  HENT:     Head: Normocephalic and atraumatic.     Right Ear: External ear normal.     Left Ear: External ear normal.      Mouth/Throat:     Mouth: Mucous membranes are moist.     Pharynx: Oropharynx is clear. No oropharyngeal exudate or posterior oropharyngeal erythema.  Eyes:     General:        Right eye: No discharge.        Left eye: No discharge.     Extraocular Movements: Extraocular movements intact.     Conjunctiva/sclera: Conjunctivae normal.     Pupils: Pupils are equal, round, and reactive to light.  Cardiovascular:     Rate and Rhythm: Normal rate and regular rhythm.  Pulmonary:     Effort: Pulmonary effort is normal.     Breath sounds: Normal breath sounds.  Abdominal:     General: Bowel sounds are normal.  Musculoskeletal:     Cervical back: Bony tenderness (mild ttp C8 without subluxation) present. No rigidity or spasms. Decreased range of motion (decreased rotation to left > decreased rot to right. ).  Lymphadenopathy:     Cervical: No cervical adenopathy.  Skin:    General: Skin is warm and dry.  Neurological:     Mental Status: He is alert and oriented to person, place, and time.  Psychiatric:        Mood and Affect: Mood normal.        Behavior: Behavior normal.     BP 140/82   Pulse 78   Temp (!) 97 F (36.1 C) (Temporal)   Ht 5\' 8"  (1.727 m)   Wt 168 lb 9.6 oz (76.5 kg)   SpO2 96%   BMI 25.64 kg/m  Wt Readings from Last 3 Encounters:  05/27/20 168 lb 9.6 oz (76.5 kg)  11/24/19 164 lb 12.8 oz (74.8 kg)  12/06/18 163 lb 4 oz (74 kg)     Health Maintenance Due  Topic Date Due  . PNA vac Low Risk Adult (2 of 2 - PPSV23) 05/26/2018    There are no preventive care reminders to display for this patient.  Lab Results  Component Value Date   TSH 0.57 03/26/2017   Lab Results  Component Value Date   WBC 6.5 11/29/2019   HGB 12.6 (L) 11/29/2019   HCT 37.1 (L) 11/29/2019   MCV 90.7 11/29/2019   PLT 365.0 11/29/2019   Lab Results  Component Value Date   NA 137 11/29/2019   K 3.8 11/29/2019   CO2 30 11/29/2019   GLUCOSE 99 11/29/2019   BUN 20 11/29/2019    CREATININE 1.36 11/29/2019   BILITOT 1.0 11/29/2019   ALKPHOS 82 11/29/2019   AST 20 11/29/2019   ALT 16 11/29/2019   PROT 7.4 11/29/2019   ALBUMIN 4.3 11/29/2019  CALCIUM 9.5 11/29/2019   ANIONGAP 7 12/06/2018   GFR 53.65 (L) 11/29/2019   Lab Results  Component Value Date   CHOL 190 11/29/2019   Lab Results  Component Value Date   HDL 46.70 11/29/2019   Lab Results  Component Value Date   LDLCALC 130 (H) 11/29/2019   Lab Results  Component Value Date   TRIG 62.0 11/29/2019   Lab Results  Component Value Date   CHOLHDL 4 11/29/2019   No results found for: HGBA1C    Assessment & Plan:   Problem List Items Addressed This Visit      Cardiovascular and Mediastinum   Essential hypertension   Relevant Orders   Comprehensive metabolic panel     Other   Depression, recurrent ([place])   Thiamine deficiency   Relevant Orders   Vitamin B1   Anemia   Relevant Orders   CBC   Iron deficiency - Primary   Relevant Orders   CBC   Iron, TIBC and Ferritin Panel   B12 deficiency   Relevant Orders   Vitamin B12   Depression, major, single episode, complete remission ([place])    Other Visit Diagnoses    Crepitus of cervical spine       Relevant Orders   DG Cervical Spine Complete      No orders of the defined types were placed in this encounter.   Follow-up: Return in about 6 months (around 11/26/2020), or if symptoms worsen or fail to improve.  We discussed using alternative medications for his chronic pain but he would like to continue with what he is doing because it is working well for him.  Willing to consider a change after retirement.  He is aware of this medications on his red blood cell production.  Libby Maw, MD

## 2020-05-28 LAB — COMPREHENSIVE METABOLIC PANEL
ALT: 19 U/L (ref 0–53)
AST: 23 U/L (ref 0–37)
Albumin: 4.3 g/dL (ref 3.5–5.2)
Alkaline Phosphatase: 97 U/L (ref 39–117)
BUN: 25 mg/dL — ABNORMAL HIGH (ref 6–23)
CO2: 32 mEq/L (ref 19–32)
Calcium: 9.5 mg/dL (ref 8.4–10.5)
Chloride: 99 mEq/L (ref 96–112)
Creatinine, Ser: 1.3 mg/dL (ref 0.40–1.50)
GFR: 57.05 mL/min — ABNORMAL LOW (ref >60.00)
Glucose, Bld: 101 mg/dL — ABNORMAL HIGH (ref 70–99)
Potassium: 3.9 mEq/L (ref 3.5–5.1)
Sodium: 141 mEq/L (ref 135–145)
Total Bilirubin: 0.4 mg/dL (ref 0.2–1.2)
Total Protein: 7.8 g/dL (ref 6.0–8.3)

## 2020-05-28 LAB — CBC
HCT: 34.8 % — ABNORMAL LOW (ref 39.0–52.0)
Hemoglobin: 11.8 g/dL — ABNORMAL LOW (ref 13.0–17.0)
MCHC: 33.8 g/dL (ref 30.0–36.0)
MCV: 90.6 fl (ref 78.0–100.0)
Platelets: 365 10*3/uL (ref 150.0–400.0)
RBC: 3.84 Mil/uL — ABNORMAL LOW (ref 4.22–5.81)
RDW: 14.2 % (ref 11.5–15.5)
WBC: 8.4 10*3/uL (ref 4.0–10.5)

## 2020-05-28 LAB — VITAMIN B12: Vitamin B-12: 1066 pg/mL — ABNORMAL HIGH (ref 211–911)

## 2020-05-29 ENCOUNTER — Telehealth: Payer: Self-pay

## 2020-05-29 NOTE — Telephone Encounter (Signed)
Patient calling for lab results. Patient verbally understood results have not been viewed as of now but someone will give him a call as soon as they are viewed.

## 2020-06-03 LAB — IRON,TIBC AND FERRITIN PANEL
%SAT: 21 % (calc) (ref 20–48)
Ferritin: 190 ng/mL (ref 24–380)
Iron: 66 ug/dL (ref 50–180)
TIBC: 311 mcg/dL (calc) (ref 250–425)

## 2020-06-03 LAB — VITAMIN B1

## 2020-11-26 ENCOUNTER — Other Ambulatory Visit: Payer: Self-pay

## 2020-11-26 ENCOUNTER — Encounter: Payer: Self-pay | Admitting: Family Medicine

## 2020-11-26 ENCOUNTER — Ambulatory Visit: Payer: BC Managed Care – PPO | Admitting: Family Medicine

## 2020-11-26 VITALS — BP 128/68 | HR 68 | Temp 97.2°F | Ht 68.0 in | Wt 161.8 lb

## 2020-11-26 DIAGNOSIS — L280 Lichen simplex chronicus: Secondary | ICD-10-CM | POA: Insufficient documentation

## 2020-11-26 DIAGNOSIS — E538 Deficiency of other specified B group vitamins: Secondary | ICD-10-CM

## 2020-11-26 DIAGNOSIS — E611 Iron deficiency: Secondary | ICD-10-CM | POA: Diagnosis not present

## 2020-11-26 DIAGNOSIS — I10 Essential (primary) hypertension: Secondary | ICD-10-CM

## 2020-11-26 DIAGNOSIS — E519 Thiamine deficiency, unspecified: Secondary | ICD-10-CM | POA: Diagnosis not present

## 2020-11-26 DIAGNOSIS — D649 Anemia, unspecified: Secondary | ICD-10-CM | POA: Diagnosis not present

## 2020-11-26 DIAGNOSIS — Z0000 Encounter for general adult medical examination without abnormal findings: Secondary | ICD-10-CM

## 2020-11-26 DIAGNOSIS — F325 Major depressive disorder, single episode, in full remission: Secondary | ICD-10-CM

## 2020-11-26 MED ORDER — NYSTATIN-TRIAMCINOLONE 100000-0.1 UNIT/GM-% EX OINT: 1.0000 "application " | TOPICAL_OINTMENT | Freq: Two times a day (BID) | CUTANEOUS | 0 refills | Status: AC

## 2020-11-26 NOTE — Progress Notes (Signed)
Established Patient Office Visit  Subjective:  Patient ID: Luke Odonnell, male    DOB: [birth year]  Age: 65 y.o. MRN: [id number]  CC:  Chief Complaint  Patient presents with   Follow-up    6 mo f/u. Iron deficiency. No main concerns.     HPI TYDUS SANMIGUEL presents for a physical exam and follow-up of his current medical issues.  Blood pressure well controlled with Diovan HCT.  Depression and anxiety controlled well with Paxil.  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  He is taking multivitamin for B12 and B1 deficiency.  Flonase is controlling allergy rhinitis signs and symptoms.  He has a rash on his leg that he would like for me to see.  It is extremely pruritic.  His sister continues to help out with his chronic neck and back pain.  Continues to work 50 hours a week.  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  Past Medical History:  Diagnosis Date   Abnormal SPEP    Adenomatous polyp of colon 2003   Decreased sex drive    Depression    Dysgeusia    Hiatal hernia    Hypertension    Iron deficiency anemia    Thiamine deficiency     Past Surgical History:  Procedure Laterality Date   BACK SURGERY     x 4: neck and lower back fusions    Family History  Problem Relation Age of Onset   Colonic polyp Father    Stroke Father    Esophageal cancer Neg Hx    Colon cancer Neg Hx    Rectal cancer Neg Hx    Stomach cancer Neg Hx     Social History   Socioeconomic History   Marital status: Married    Spouse name: Not on file   Number of children: Not on file   Years of education: Not on file   Highest education level: Not on file  Occupational History   Not on file  Tobacco Use   Smoking status: Never   Smokeless tobacco: Never  Vaping Use   Vaping Use: Never used  Substance and Sexual Activity   Alcohol use: Yes    Alcohol/week: 1.0 standard drink    Types: 1 Cans of beer per week   Drug use: Not  Currently   Sexual activity: Not on file  Other Topics Concern   Not on file  Social History Narrative   Not on file   Social Determinants of Health   Financial Resource Strain: Not on file  Food Insecurity: Not on file  Transportation Needs: Not on file  Physical Activity: Not on file  Stress: Not on file  Social Connections: Not on file  Intimate Partner Violence: Not on file    Outpatient Medications Prior to Visit  Medication Sig Dispense Refill   aspirin EC 81 MG tablet Take 81 mg by mouth daily.     fluticasone (FLONASE) 50 MCG/ACT nasal spray Place 2 sprays into both nostrils daily. 16 g 6   Multiple Vitamin (MULTI-VITAMIN) tablet Take 1 tablet by mouth daily.     PARoxetine (PAXIL) 20 MG tablet Take 1 tablet (20 mg total) by mouth daily. 90 tablet 2   valsartan-hydrochlorothiazide (DIOVAN-HCT) 160-25 MG tablet Take 1 tablet by mouth daily. 90 tablet 3   No facility-administered medications prior to visit.    Allergies  Allergen Reactions   Neurontin [  Gabapentin] Swelling    Dropped BP, in hospital for 3 days.    ROS Review of Systems  Constitutional:  Negative for chills, diaphoresis, fatigue, fever and unexpected weight change.  HENT: Negative.    Eyes:  Negative for photophobia and visual disturbance.  Respiratory: Negative.    Cardiovascular: Negative.   Gastrointestinal: Negative.   Endocrine: Negative for polyphagia and polyuria.  Genitourinary:  Negative for difficulty urinating, frequency and urgency.  Musculoskeletal:  Positive for back pain, neck pain and neck stiffness.  Skin:  Positive for rash.  Neurological:  Negative for speech difficulty and weakness.  Hematological:  Does not bruise/bleed easily.  Depression screen [facility] 2/9 11/26/2020 05/27/2020 11/24/2019  Decreased Interest 0 0 0  Down, Depressed, Hopeless 0 0 0  PHQ - 2 Score 0 0 0  Altered sleeping 0 - 0  Tired, decreased energy 0 - 1  Change in appetite 0 - 0  Feeling bad or failure  about yourself  0 - 0  Trouble concentrating 0 - 0  Moving slowly or fidgety/restless 0 - 0  Suicidal thoughts 0 - 0  PHQ-9 Score 0 - 1  Difficult doing work/chores Not difficult at all - Not difficult at all       Objective:    Physical Exam Vitals and nursing note reviewed.  Constitutional:      General: He is not in acute distress.    Appearance: Normal appearance. He is normal weight. He is not ill-appearing, toxic-appearing or diaphoretic.  HENT:     Head: Normocephalic and atraumatic.     Right Ear: Tympanic membrane, ear canal and external ear normal.     Left Ear: Tympanic membrane, ear canal and external ear normal.     Mouth/Throat:     Mouth: Mucous membranes are moist.     Pharynx: Oropharynx is clear. No oropharyngeal exudate or posterior oropharyngeal erythema.  Eyes:     General: No scleral icterus.       Right eye: No discharge.        Left eye: No discharge.     Extraocular Movements: Extraocular movements intact.     Conjunctiva/sclera: Conjunctivae normal.     Pupils: Pupils are equal, round, and reactive to light.  Neck:     Vascular: No carotid bruit.  Cardiovascular:     Rate and Rhythm: Normal rate and regular rhythm.  Pulmonary:     Effort: Pulmonary effort is normal.     Breath sounds: Normal breath sounds.  Abdominal:     General: Abdomen is flat. Bowel sounds are normal. There is no distension.     Palpations: There is no mass.     Tenderness: There is no abdominal tenderness.     Hernia: No hernia is present.  Genitourinary:    Prostate: Not enlarged, not tender and no nodules present.     Rectum: Guaiac result negative. No mass, tenderness, anal fissure, external hemorrhoid or internal hemorrhoid. Normal anal tone.  Musculoskeletal:     Cervical back: No rigidity or tenderness.     Right lower leg: No edema.     Left lower leg: No edema.  Lymphadenopathy:     Cervical: No cervical adenopathy.  Skin:    General: Skin is warm and dry.   Neurological:     Mental Status: He is alert.  Psychiatric:        Mood and Affect: Mood normal.        Behavior: Behavior normal.  BP 128/68 (BP Location: Right Arm, Patient Position: Sitting, Cuff Size: Normal)   Pulse 68   Temp (!) 97.2 F (36.2 C) (Temporal)   Ht 5\' 8"  (1.727 m)   Wt 161 lb 12.8 oz (73.4 kg)   SpO2 97%   BMI 24.60 kg/m  Wt Readings from Last 3 Encounters:  11/26/20 161 lb 12.8 oz (73.4 kg)  05/27/20 168 lb 9.6 oz (76.5 kg)  11/24/19 164 lb 12.8 oz (74.8 kg)     Health Maintenance Due  Topic Date Due   COVID-19 Vaccine (1) Never done   Zoster Vaccines- Shingrix (1 of 2) Never done    There are no preventive care reminders to display for this patient.  Lab Results  Component Value Date   TSH 0.57 03/26/2017   Lab Results  Component Value Date   WBC 8.4 05/27/2020   HGB 11.8 (L) 05/27/2020   HCT 34.8 (L) 05/27/2020   MCV 90.6 05/27/2020   PLT 365.0 05/27/2020   Lab Results  Component Value Date   NA 141 05/27/2020   K 3.9 05/27/2020   CO2 32 05/27/2020   GLUCOSE 101 (H) 05/27/2020   BUN 25 (H) 05/27/2020   CREATININE 1.30 05/27/2020   BILITOT 0.4 05/27/2020   ALKPHOS 97 05/27/2020   AST 23 05/27/2020   ALT 19 05/27/2020   PROT 7.8 05/27/2020   ALBUMIN 4.3 05/27/2020   CALCIUM 9.5 05/27/2020   ANIONGAP 7 12/06/2018   GFR 57.05 (L) 05/27/2020   Lab Results  Component Value Date   CHOL 190 11/29/2019   Lab Results  Component Value Date   HDL 46.70 11/29/2019   Lab Results  Component Value Date   LDLCALC 130 (H) 11/29/2019   Lab Results  Component Value Date   TRIG 62.0 11/29/2019   Lab Results  Component Value Date   CHOLHDL 4 11/29/2019   No results found for: HGBA1C    Assessment & Plan:   Problem List Items Addressed This Visit       Cardiovascular and Mediastinum   Essential hypertension   Relevant Orders   Comprehensive metabolic panel   Urinalysis, Routine w reflex microscopic     Musculoskeletal  and Integument   LSC (lichen simplex chronicus)   Relevant Medications   nystatin-triamcinolone ointment (MYCOLOG)     Other   Thiamine deficiency   Relevant Orders   Vitamin B1   Anemia   Relevant Orders   CBC w/Diff   Iron deficiency - Primary   Relevant Orders   Iron, TIBC and Ferritin Panel   B12 deficiency   Relevant Orders   Vitamin B12   Healthcare maintenance   Relevant Orders   Lipid panel   PSA   Depression, major, single episode, complete remission ([place])    Meds ordered this encounter  Medications   nystatin-triamcinolone ointment (MYCOLOG)    Sig: Apply 1 application topically 2 (two) times daily.    Dispense:  60 g    Refill:  0    Follow-up: Return in about 1 month (around 12/27/2020), or Return in 1 month if rash is not cleared otherwise in 1 year..  Given information on health maintenance and disease prevention as well as eczema.  Will use nystatin triamcinolone ointment twice daily for a month and return if not clear otherwise we will see him back in 1 year.  Continue Diovan HCT and paroxetine.  Jacque is going to come back for his labs.  He evaluation tell her she  has been have to come back and let me I should have done  Libby Maw, MD

## 2020-11-27 ENCOUNTER — Other Ambulatory Visit (INDEPENDENT_AMBULATORY_CARE_PROVIDER_SITE_OTHER): Payer: BC Managed Care – PPO

## 2020-11-27 ENCOUNTER — Other Ambulatory Visit: Payer: Self-pay

## 2020-11-27 DIAGNOSIS — E519 Thiamine deficiency, unspecified: Secondary | ICD-10-CM | POA: Diagnosis not present

## 2020-11-27 DIAGNOSIS — Z0000 Encounter for general adult medical examination without abnormal findings: Secondary | ICD-10-CM | POA: Diagnosis not present

## 2020-11-27 DIAGNOSIS — D649 Anemia, unspecified: Secondary | ICD-10-CM

## 2020-11-27 DIAGNOSIS — E538 Deficiency of other specified B group vitamins: Secondary | ICD-10-CM | POA: Diagnosis not present

## 2020-11-27 DIAGNOSIS — I10 Essential (primary) hypertension: Secondary | ICD-10-CM | POA: Diagnosis not present

## 2020-11-27 DIAGNOSIS — E611 Iron deficiency: Secondary | ICD-10-CM | POA: Diagnosis not present

## 2020-11-28 LAB — URINALYSIS, ROUTINE W REFLEX MICROSCOPIC
Bilirubin Urine: NEGATIVE
Hgb urine dipstick: NEGATIVE
Ketones, ur: NEGATIVE
Nitrite: NEGATIVE
Specific Gravity, Urine: 1.025 (ref 1.000–1.030)
Total Protein, Urine: NEGATIVE
Urine Glucose: NEGATIVE
Urobilinogen, UA: 0.2 (ref 0.0–1.0)
pH: 6 (ref 5.0–8.0)

## 2020-11-28 LAB — CBC WITH DIFFERENTIAL/PLATELET
Basophils Absolute: 0 10*3/uL (ref 0.0–0.1)
Basophils Relative: 0.4 % (ref 0.0–3.0)
Eosinophils Absolute: 0 10*3/uL (ref 0.0–0.7)
Eosinophils Relative: 0.4 % (ref 0.0–5.0)
HCT: 33.1 % — ABNORMAL LOW (ref 39.0–52.0)
Hemoglobin: 11 g/dL — ABNORMAL LOW (ref 13.0–17.0)
Lymphocytes Relative: 21 % (ref 12.0–46.0)
Lymphs Abs: 1.6 10*3/uL (ref 0.7–4.0)
MCHC: 33.3 g/dL (ref 30.0–36.0)
MCV: 91.3 fl (ref 78.0–100.0)
Monocytes Absolute: 0.6 10*3/uL (ref 0.1–1.0)
Monocytes Relative: 7.7 % (ref 3.0–12.0)
Neutro Abs: 5.3 10*3/uL (ref 1.4–7.7)
Neutrophils Relative %: 70.5 % (ref 43.0–77.0)
Platelets: 381 10*3/uL (ref 150.0–400.0)
RBC: 3.63 Mil/uL — ABNORMAL LOW (ref 4.22–5.81)
RDW: 13.9 % (ref 11.5–15.5)
WBC: 7.5 10*3/uL (ref 4.0–10.5)

## 2020-11-28 LAB — LIPID PANEL
Cholesterol: 156 mg/dL (ref 0–200)
HDL: 43.9 mg/dL (ref >39.00)
LDL Cholesterol: 97 mg/dL (ref 0–99)
NonHDL: 112.01
Total CHOL/HDL Ratio: 4
Triglycerides: 77 mg/dL (ref 0.0–149.0)
VLDL: 15.4 mg/dL (ref 0.0–40.0)

## 2020-11-28 LAB — COMPREHENSIVE METABOLIC PANEL
ALT: 16 U/L (ref 0–53)
AST: 25 U/L (ref 0–37)
Albumin: 4.5 g/dL (ref 3.5–5.2)
Alkaline Phosphatase: 79 U/L (ref 39–117)
BUN: 23 mg/dL (ref 6–23)
CO2: 31 mEq/L (ref 19–32)
Calcium: 9.6 mg/dL (ref 8.4–10.5)
Chloride: 100 mEq/L (ref 96–112)
Creatinine, Ser: 1.39 mg/dL (ref 0.40–1.50)
GFR: 52.46 mL/min — ABNORMAL LOW (ref >60.00)
Glucose, Bld: 107 mg/dL — ABNORMAL HIGH (ref 70–99)
Potassium: 3.8 mEq/L (ref 3.5–5.1)
Sodium: 141 mEq/L (ref 135–145)
Total Bilirubin: 0.6 mg/dL (ref 0.2–1.2)
Total Protein: 7.4 g/dL (ref 6.0–8.3)

## 2020-11-28 LAB — PSA: PSA: 0.3 ng/mL (ref 0.10–4.00)

## 2020-11-28 LAB — VITAMIN B12: Vitamin B-12: 944 pg/mL — ABNORMAL HIGH (ref 211–911)

## 2020-12-04 LAB — VITAMIN B1: Vitamin B1 (Thiamine): <6 nmol/L — ABNORMAL LOW (ref 8–30)

## 2020-12-04 LAB — IRON,TIBC AND FERRITIN PANEL
%SAT: 15 % (calc) — ABNORMAL LOW (ref 20–48)
Ferritin: 289 ng/mL (ref 24–380)
Iron: 45 ug/dL — ABNORMAL LOW (ref 50–180)
TIBC: 296 mcg/dL (calc) (ref 250–425)

## 2020-12-06 ENCOUNTER — Other Ambulatory Visit: Payer: Self-pay | Admitting: Family Medicine

## 2020-12-06 DIAGNOSIS — F325 Major depressive disorder, single episode, in full remission: Secondary | ICD-10-CM

## 2020-12-06 DIAGNOSIS — I10 Essential (primary) hypertension: Secondary | ICD-10-CM

## 2020-12-09 MED ORDER — THIAMINE HCL 100 MG PO TABS: 100.0000 mg | ORAL_TABLET | Freq: Every day | ORAL | 1 refills | Status: DC

## 2020-12-09 NOTE — Addendum Note (Signed)
Addended by: Jon Billings on: 12/09/2020 09:32 AM   Modules accepted: Orders

## 2021-10-13 ENCOUNTER — Other Ambulatory Visit: Payer: Self-pay | Admitting: Family Medicine

## 2021-10-13 DIAGNOSIS — F325 Major depressive disorder, single episode, in full remission: Secondary | ICD-10-CM

## 2021-12-28 IMAGING — DX DG CERVICAL SPINE COMPLETE 4+V
5 series · 5 of 5 positions shown · non-contrast
Comparison: 04/24/2013

CLINICAL DATA: Hans-Ruedi crepitus with range of motion, prior cervical
fusion

EXAM:
CERVICAL SPINE - COMPLETE 4+ VIEW

[cervical spine ap]
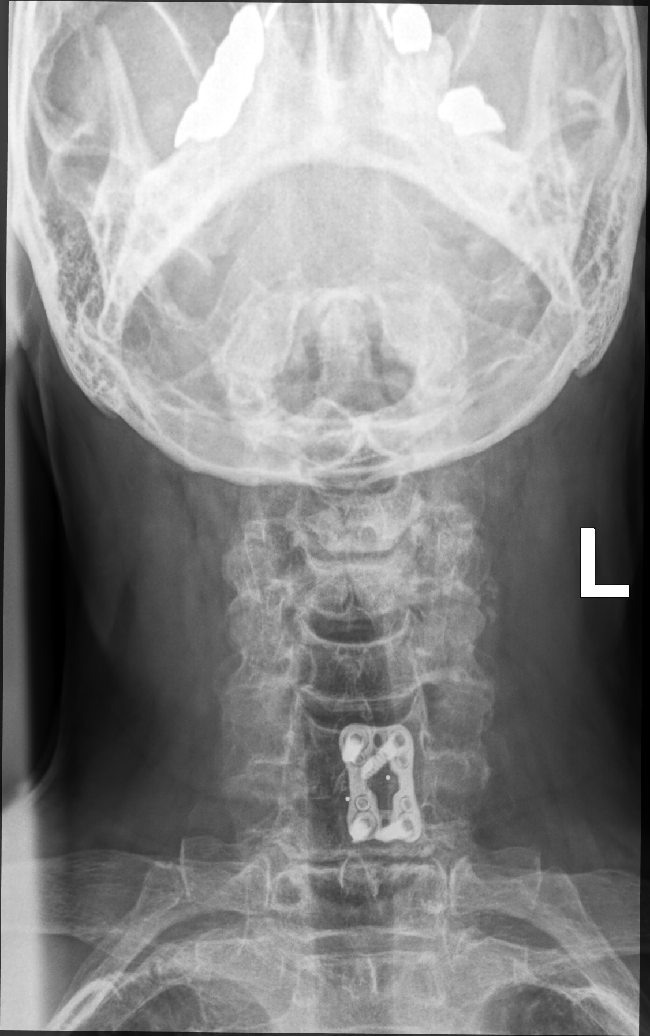

[cervical spine oblique (1 of 2)]
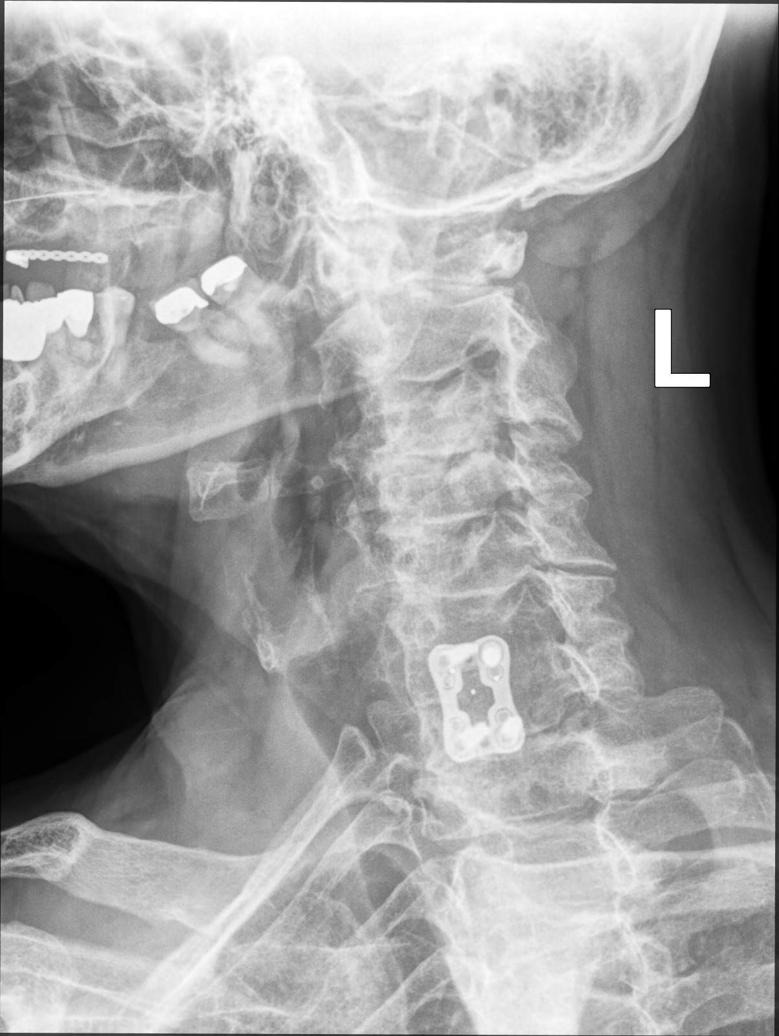

[cervical spine oblique (2 of 2)]
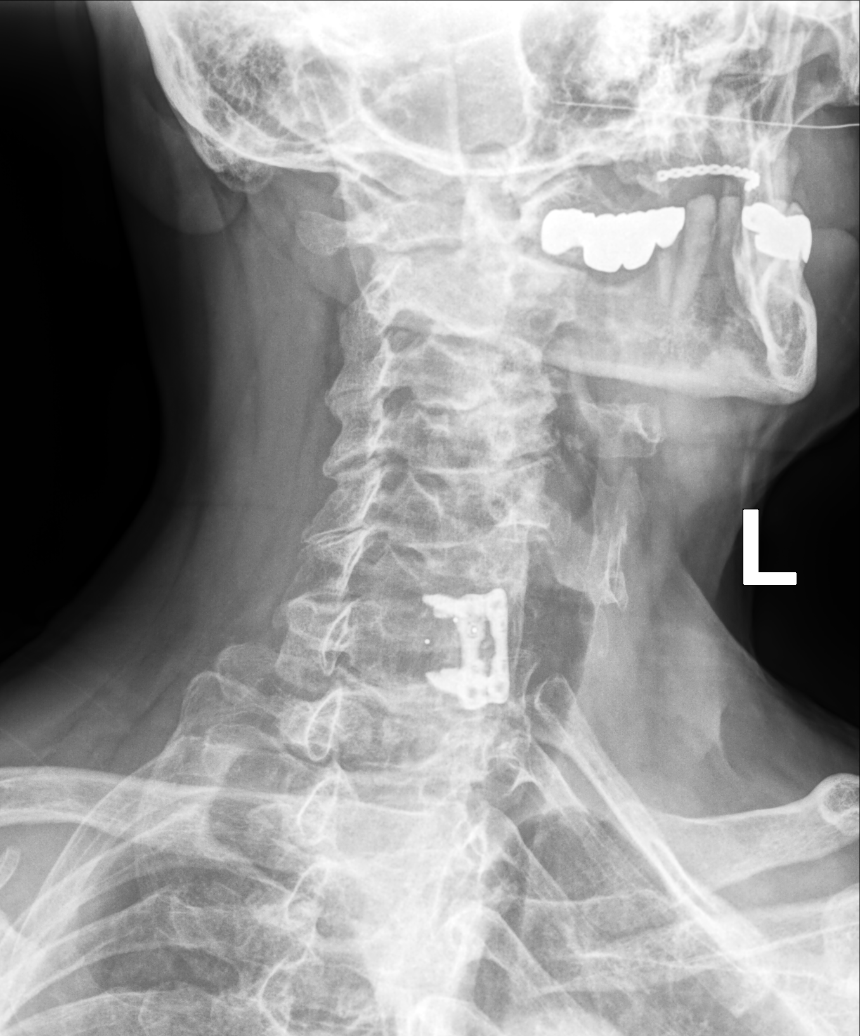

[cervical spine lat]
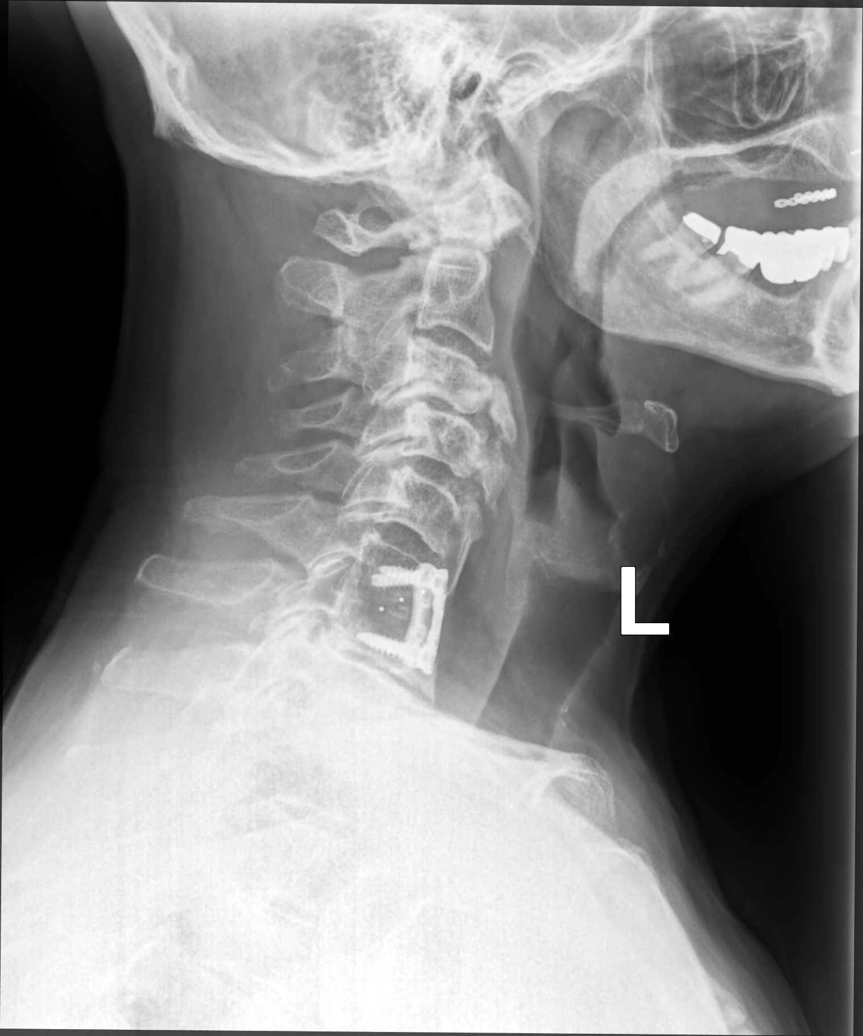

[cervical spine open mouth ap]
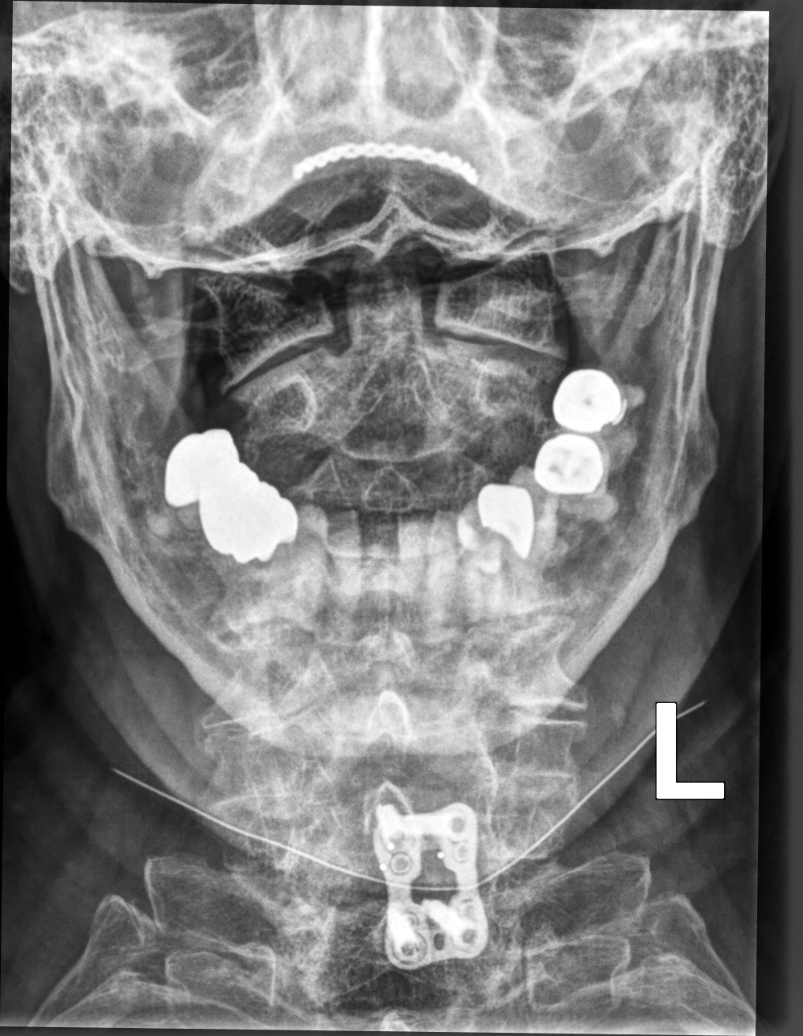

[5 of 5 positions shown; findings below may reference images not displayed]

FINDINGS: Osseous demineralization.

Prior anterior fusion C6-C7, with disc prosthesis in good bony
fusion.

Progressive degenerative disc disease throughout remainder of
cervical spine, greatest at C7-T1.

Retrolisthesis at C2-C3, 3 mm, previously 2 mm.

Calcification of anterior longitudinal ligament at C2-C5.

Scattered facet degenerative changes.

No fracture, additional subluxation, or bone destruction.

Bony foramina incompletely profiled.

Lung apices clear.
IMPRESSION: Progressive degenerative disc disease changes of the cervical spine
with slightly increased retrolisthesis at C2-C3.

Stable prior anterior fusion of C6-C7.

## 2022-01-08 ENCOUNTER — Other Ambulatory Visit: Payer: Self-pay | Admitting: Family Medicine

## 2022-01-08 DIAGNOSIS — I10 Essential (primary) hypertension: Secondary | ICD-10-CM

## 2022-03-13 ENCOUNTER — Encounter: Payer: Self-pay | Admitting: Family Medicine

## 2022-03-13 ENCOUNTER — Ambulatory Visit: Payer: BC Managed Care – PPO | Admitting: Family Medicine

## 2022-03-13 VITALS — BP 136/78 | HR 73 | Temp 97.8°F | Ht 68.0 in | Wt 158.0 lb

## 2022-03-13 DIAGNOSIS — E611 Iron deficiency: Secondary | ICD-10-CM | POA: Diagnosis not present

## 2022-03-13 DIAGNOSIS — R300 Dysuria: Secondary | ICD-10-CM

## 2022-03-13 DIAGNOSIS — N3001 Acute cystitis with hematuria: Secondary | ICD-10-CM

## 2022-03-13 DIAGNOSIS — E519 Thiamine deficiency, unspecified: Secondary | ICD-10-CM | POA: Diagnosis not present

## 2022-03-13 DIAGNOSIS — R369 Urethral discharge, unspecified: Secondary | ICD-10-CM | POA: Diagnosis not present

## 2022-03-13 LAB — POCT URINALYSIS DIPSTICK
Bilirubin, UA: NEGATIVE
Glucose, UA: NEGATIVE
Ketones, UA: NEGATIVE
Nitrite, UA: NEGATIVE
Protein, UA: POSITIVE — AB
Spec Grav, UA: 1.015 (ref 1.010–1.025)
Urobilinogen, UA: 0.2 E.U./dL
pH, UA: 6 (ref 5.0–8.0)

## 2022-03-13 LAB — URINALYSIS, ROUTINE W REFLEX MICROSCOPIC
Bilirubin Urine: NEGATIVE
Ketones, ur: NEGATIVE
Nitrite: NEGATIVE
Specific Gravity, Urine: 1.02 (ref 1.000–1.030)
Total Protein, Urine: NEGATIVE
Urine Glucose: NEGATIVE
Urobilinogen, UA: 0.2 (ref 0.0–1.0)
pH: 6 (ref 5.0–8.0)

## 2022-03-13 MED ORDER — AZITHROMYCIN 1 G PO PACK
1.0000 g | PACK | Freq: Once | ORAL | 0 refills | Status: AC
Start: 2022-03-13 — End: 2022-03-13

## 2022-03-13 MED ORDER — CEFTRIAXONE SODIUM 500 MG IJ SOLR
500.0000 mg | Freq: Once | INTRAMUSCULAR | Status: AC
  Administered 2022-03-13: 500 mg via INTRAMUSCULAR

## 2022-03-13 NOTE — Progress Notes (Signed)
Established Patient Office Visit   Subjective:  Patient ID: Luke Odonnell, male    DOB: 1955-07-26  Age: 67 y.o. MRN: [id number]  Chief Complaint  Patient presents with   Hematuria    Blood in urine, dysuria, creamy discharge from penis symptoms x 1 week.     Hematuria Irritative symptoms include frequency. Associated symptoms include dysuria. Pertinent negatives include no abdominal pain, chills, fever or flank pain.   Encounter Diagnoses  Name Primary?   Dysuria Yes   Acute cystitis with hematuria    Discharge from penis    Thiamine deficiency    Iron deficiency    1 week history of urinary frequency with dysuria with a creamish white discharge.  Sexual encounter with a new partner 3 weeks prior.  Continues thiamine and iron for thiamine and iron deficiency.   Review of Systems  Constitutional: Negative.  Negative for chills and fever.  HENT: Negative.    Eyes:  Negative for blurred vision, discharge and redness.  Respiratory: Negative.    Cardiovascular: Negative.   Gastrointestinal:  Negative for abdominal pain.  Genitourinary:  Positive for dysuria, frequency and hematuria. Negative for flank pain.  Musculoskeletal: Negative.  Negative for myalgias.  Skin:  Negative for rash.  Neurological:  Negative for tingling, loss of consciousness and weakness.  Endo/Heme/Allergies:  Negative for polydipsia.     Current Outpatient Medications:    aspirin EC 81 MG tablet, Take 81 mg by mouth daily., Disp: , Rfl:    azithromycin (ZITHROMAX) 1 g powder, Take 1 packet (1 g total) by mouth once for 1 dose., Disp: 1 each, Rfl: 0   fluticasone (FLONASE) 50 MCG/ACT nasal spray, Place 2 sprays into both nostrils daily., Disp: 16 g, Rfl: 6   Multiple Vitamin (MULTI-VITAMIN) tablet, Take 1 tablet by mouth daily., Disp: , Rfl:    nystatin-triamcinolone ointment (MYCOLOG), Apply 1 application topically 2 (two) times daily., Disp: 60 g, Rfl: 0   PARoxetine (PAXIL) 20 MG tablet, TAKE 1 TABLET  BY MOUTH EVERY DAY, Disp: 90 tablet, Rfl: 2   thiamine 100 MG tablet, Take 1 tablet (100 mg total) by mouth daily., Disp: 90 tablet, Rfl: 1   valsartan-hydrochlorothiazide (DIOVAN-HCT) 160-25 MG tablet, TAKE 1 TABLET BY MOUTH EVERY DAY, Disp: 90 tablet, Rfl: 0  Current Facility-Administered Medications:    cefTRIAXone (ROCEPHIN) injection 500 mg, 500 mg, Intramuscular, Once, Libby Maw, MD   Objective:     BP 136/78 (BP Location: Right Arm, Patient Position: Sitting, Cuff Size: Normal)   Pulse 73   Temp 97.8 F (36.6 C) (Temporal)   Ht '5\' 8"'$  (1.727 m)   Wt 158 lb (71.7 kg)   SpO2 96%   BMI 24.02 kg/m    Physical Exam Constitutional:      General: He is not in acute distress.    Appearance: Normal appearance. He is not ill-appearing, toxic-appearing or diaphoretic.  HENT:     Head: Normocephalic and atraumatic.     Right Ear: External ear normal.     Left Ear: External ear normal.  Eyes:     General: No scleral icterus.       Right eye: No discharge.        Left eye: No discharge.     Extraocular Movements: Extraocular movements intact.     Conjunctiva/sclera: Conjunctivae normal.  Pulmonary:     Effort: Pulmonary effort is normal. No respiratory distress.  Abdominal:     General: Bowel sounds are normal.  Tenderness: There is no abdominal tenderness. There is no right CVA tenderness or left CVA tenderness.  Skin:    General: Skin is warm and dry.  Neurological:     Mental Status: He is alert and oriented to person, place, and time.  Psychiatric:        Mood and Affect: Mood normal.        Behavior: Behavior normal.      Results for orders placed or performed in visit on 03/13/22  POCT Urinalysis Dipstick  Result Value Ref Range   Color, UA yellow    Clarity, UA     Glucose, UA Negative Negative   Bilirubin, UA neg    Ketones, UA neg    Spec Grav, UA 1.015 1.010 - 1.025   Blood, UA 2+    pH, UA 6.0 5.0 - 8.0   Protein, UA Positive (A)  Negative   Urobilinogen, UA 0.2 0.2 or 1.0 E.U./dL   Nitrite, UA neg    Leukocytes, UA Large (3+) (A) Negative   Appearance     Odor        The 10-year ASCVD risk score (Arnett DK, et al., 2019) is: 18.7%    Assessment & Plan:   Dysuria -     POCT urinalysis dipstick -     cefTRIAXone Sodium -     Azithromycin; Take 1 packet (1 g total) by mouth once for 1 dose.  Dispense: 1 each; Refill: 0 -     Urinalysis, Routine w reflex microscopic -     Urine Culture -     Urine cytology ancillary only  Acute cystitis with hematuria -     cefTRIAXone Sodium -     Azithromycin; Take 1 packet (1 g total) by mouth once for 1 dose.  Dispense: 1 each; Refill: 0 -     Urinalysis, Routine w reflex microscopic -     Urine Culture -     Urine cytology ancillary only  Discharge from penis -     cefTRIAXone Sodium -     Urine cytology ancillary only -     HIV Antibody (routine testing w rflx) -     RPR  Thiamine deficiency -     Vitamin B1  Iron deficiency -     Iron, TIBC and Ferritin Panel    Return if symptoms worsen or fail to improve.    Libby Maw, MD

## 2022-03-18 LAB — IRON,TIBC AND FERRITIN PANEL
%SAT: 25 % (calc) (ref 20–48)
Ferritin: 228 ng/mL (ref 24–380)
Iron: 79 ug/dL (ref 50–180)
TIBC: 311 mcg/dL (calc) (ref 250–425)

## 2022-03-18 LAB — URINE CULTURE
MICRO NUMBER:: 14478977
Result:: NO GROWTH
SPECIMEN QUALITY:: ADEQUATE

## 2022-03-18 LAB — VITAMIN B1: Vitamin B1 (Thiamine): 14 nmol/L (ref 8–30)

## 2022-03-18 LAB — RPR: RPR Ser Ql: NONREACTIVE

## 2022-03-18 LAB — HIV ANTIBODY (ROUTINE TESTING W REFLEX): HIV 1&2 Ab, 4th Generation: NONREACTIVE

## 2022-04-03 ENCOUNTER — Encounter: Payer: Self-pay | Admitting: Family Medicine

## 2022-04-03 ENCOUNTER — Ambulatory Visit: Payer: BC Managed Care – PPO | Admitting: Family Medicine

## 2022-04-03 VITALS — BP 126/74 | HR 86 | Temp 97.1°F | Ht 68.0 in | Wt 153.8 lb

## 2022-04-03 DIAGNOSIS — R300 Dysuria: Secondary | ICD-10-CM

## 2022-04-03 DIAGNOSIS — R319 Hematuria, unspecified: Secondary | ICD-10-CM

## 2022-04-03 LAB — POCT URINALYSIS DIPSTICK
Bilirubin, UA: NEGATIVE
Blood, UA: NEGATIVE
Glucose, UA: NEGATIVE
Ketones, UA: NEGATIVE
Leukocytes, UA: NEGATIVE
Nitrite, UA: NEGATIVE
Protein, UA: POSITIVE — AB
Spec Grav, UA: 1.02 (ref 1.010–1.025)
Urobilinogen, UA: NEGATIVE E.U./dL — AB
pH, UA: 5.5 (ref 5.0–8.0)

## 2022-04-03 NOTE — Progress Notes (Signed)
Established Patient Office Visit   Subjective:  Patient ID: ARLINE PERSON, male    DOB: April 14, 1955  Age: 67 y.o. MRN: [id number]  Chief Complaint  Patient presents with   Hematuria    Blood in urine and dysuria 2 days ago.     Hematuria Irritative symptoms include frequency and urgency. Pertinent negatives include no dysuria or flank pain.   Encounter Diagnoses  Name Primary?   Dysuria Yes   Hematuria, unspecified type    For evaluation of an isolated episode of hematuria that has since cleared and not been seen again.  At that time he did experienced urgency and had trouble getting to the bathroom expediently.  He was clamping the end of his penis to prevent urination and soft blood once the urine flow came.  It has not been seen again.  He has had no dysuria, discharge, fever or chills or lower back pain.  Urine stream is strong.  He urinates 2-3 times daily   Review of Systems  Genitourinary:  Positive for frequency, hematuria and urgency. Negative for dysuria and flank pain.     Current Outpatient Medications:    aspirin EC 81 MG tablet, Take 81 mg by mouth daily., Disp: , Rfl:    fluticasone (FLONASE) 50 MCG/ACT nasal spray, Place 2 sprays into both nostrils daily., Disp: 16 g, Rfl: 6   Multiple Vitamin (MULTI-VITAMIN) tablet, Take 1 tablet by mouth daily., Disp: , Rfl:    nystatin-triamcinolone ointment (MYCOLOG), Apply 1 application topically 2 (two) times daily., Disp: 60 g, Rfl: 0   PARoxetine (PAXIL) 20 MG tablet, TAKE 1 TABLET BY MOUTH EVERY DAY, Disp: 90 tablet, Rfl: 2   valsartan-hydrochlorothiazide (DIOVAN-HCT) 160-25 MG tablet, TAKE 1 TABLET BY MOUTH EVERY DAY, Disp: 90 tablet, Rfl: 0   Objective:     BP 126/74 (BP Location: Right Arm, Patient Position: Sitting, Cuff Size: Normal)   Pulse 86   Temp (!) 97.1 F (36.2 C) (Temporal)   Ht 5' 8"$  (1.727 m)   Wt 153 lb 12.8 oz (69.8 kg)   SpO2 96%   BMI 23.39 kg/m    Physical Exam Constitutional:       General: He is not in acute distress.    Appearance: Normal appearance. He is not ill-appearing, toxic-appearing or diaphoretic.  HENT:     Head: Normocephalic and atraumatic.     Right Ear: External ear normal.     Left Ear: External ear normal.  Eyes:     General: No scleral icterus.       Right eye: No discharge.        Left eye: No discharge.     Extraocular Movements: Extraocular movements intact.     Conjunctiva/sclera: Conjunctivae normal.  Pulmonary:     Effort: Pulmonary effort is normal. No respiratory distress.  Genitourinary:    Prostate: Not enlarged, not tender and no nodules present.     Rectum: Guaiac result negative. No mass, tenderness, anal fissure, external hemorrhoid or internal hemorrhoid. Normal anal tone.  Skin:    General: Skin is warm and dry.  Neurological:     Mental Status: He is alert and oriented to person, place, and time.  Psychiatric:        Mood and Affect: Mood normal.        Behavior: Behavior normal.      Results for orders placed or performed in visit on 04/03/22  POCT Urinalysis Dipstick  Result Value Ref Range   Color,  UA Yellow    Clarity, UA Cloudy    Glucose, UA Negative Negative   Bilirubin, UA Negative    Ketones, UA Negative    Spec Grav, UA 1.020 1.010 - 1.025   Blood, UA Negative    pH, UA 5.5 5.0 - 8.0   Protein, UA Positive (A) Negative   Urobilinogen, UA negative (A) 0.2 or 1.0 E.U./dL   Nitrite, UA Negative    Leukocytes, UA Negative Negative   Appearance dark    Odor yes       The 10-year ASCVD risk score (Arnett DK, et al., 2019) is: 16.5%    Assessment & Plan:   Dysuria -     POCT urinalysis dipstick  Hematuria, unspecified type -     Urine Culture   Post prostate exam urine sample sent for culture. Return if symptoms worsen or fail to improve.    Libby Maw, MD

## 2022-04-04 LAB — URINE CULTURE
MICRO NUMBER:: 14576311
Result:: NO GROWTH
SPECIMEN QUALITY:: ADEQUATE

## 2022-05-04 ENCOUNTER — Encounter: Payer: Self-pay | Admitting: Family Medicine

## 2022-05-04 ENCOUNTER — Ambulatory Visit: Payer: BC Managed Care – PPO | Admitting: Family Medicine

## 2022-05-04 DIAGNOSIS — Z202 Contact with and (suspected) exposure to infections with a predominantly sexual mode of transmission: Secondary | ICD-10-CM | POA: Diagnosis not present

## 2022-05-04 MED ORDER — METRONIDAZOLE 500 MG PO TABS: 2000.0000 mg | ORAL_TABLET | Freq: Once | ORAL | 0 refills | Status: AC

## 2022-05-04 NOTE — Progress Notes (Signed)
Established Patient Office Visit   Subjective:  Patient ID: Luke Odonnell, male    DOB: December 07, 1955  Age: 67 y.o. MRN: [id number]  Chief Complaint  Patient presents with   Exposure to STD    Exposed to STD 2 weeks ago wife positive for Trichomonas from patient would like to be treated.     Exposure to STD Pertinent negatives include no abdominal pain, dysuria, frequency, rash or urgency.   Encounter Diagnoses  Name Primary?   Trichomonas exposure Yes   Wife recently diagnosed with trichomonas.  She is taking Flagyl.  Patient did have an extramarital encounter.  He is experiencing some mild discomfort in the genital area.  There is no dysuria or discharge.  There is no itching.   Review of Systems  Constitutional: Negative.   HENT: Negative.    Eyes:  Negative for blurred vision, discharge and redness.  Respiratory: Negative.    Cardiovascular: Negative.   Gastrointestinal:  Negative for abdominal pain.  Genitourinary: Negative.  Negative for dysuria, frequency and urgency.  Musculoskeletal: Negative.  Negative for myalgias.  Skin:  Negative for itching and rash.  Neurological:  Negative for tingling, loss of consciousness and weakness.  Endo/Heme/Allergies:  Negative for polydipsia.     Current Outpatient Medications:    aspirin EC 81 MG tablet, Take 81 mg by mouth daily., Disp: , Rfl:    fluticasone (FLONASE) 50 MCG/ACT nasal spray, Place 2 sprays into both nostrils daily., Disp: 16 g, Rfl: 6   metroNIDAZOLE (FLAGYL) 500 MG tablet, Take 4 tablets (2,000 mg total) by mouth once for 1 dose., Disp: 4 tablet, Rfl: 0   Multiple Vitamin (MULTI-VITAMIN) tablet, Take 1 tablet by mouth daily., Disp: , Rfl:    nystatin-triamcinolone ointment (MYCOLOG), Apply 1 application topically 2 (two) times daily., Disp: 60 g, Rfl: 0   PARoxetine (PAXIL) 20 MG tablet, TAKE 1 TABLET BY MOUTH EVERY DAY, Disp: 90 tablet, Rfl: 2   valsartan-hydrochlorothiazide (DIOVAN-HCT) 160-25 MG tablet, TAKE 1  TABLET BY MOUTH EVERY DAY, Disp: 90 tablet, Rfl: 0   Objective:     There were no vitals taken for this visit.   Physical Exam Constitutional:      General: He is not in acute distress.    Appearance: Normal appearance. He is not ill-appearing, toxic-appearing or diaphoretic.  HENT:     Head: Normocephalic and atraumatic.     Right Ear: External ear normal.     Left Ear: External ear normal.  Eyes:     General: No scleral icterus.       Right eye: No discharge.        Left eye: No discharge.     Extraocular Movements: Extraocular movements intact.     Conjunctiva/sclera: Conjunctivae normal.  Pulmonary:     Effort: Pulmonary effort is normal. No respiratory distress.  Skin:    General: Skin is warm and dry.  Neurological:     Mental Status: He is alert and oriented to person, place, and time.  Psychiatric:        Mood and Affect: Mood normal.        Behavior: Behavior normal.      No results found for any visits on 05/04/22.    The 10-year ASCVD risk score (Arnett DK, et al., 2019) is: 16.5%    Assessment & Plan:   Trichomonas exposure -     metroNIDAZOLE; Take 4 tablets (2,000 mg total) by mouth once for 1 dose.  Dispense: 4  tablet; Refill: 0    Return if symptoms worsen or fail to improve.  Advised not to consume alcohol with above.  Use protection.  Information was given on trichomonas.  Libby Maw, MD

## 2022-06-03 ENCOUNTER — Telehealth: Payer: Self-pay | Admitting: Family Medicine

## 2022-06-03 NOTE — Telephone Encounter (Signed)
Pt would like to have labs to ensure he is over his STD. Does he need an appt or just the labs?

## 2022-06-04 NOTE — Telephone Encounter (Signed)
Done

## 2022-06-11 ENCOUNTER — Other Ambulatory Visit (HOSPITAL_COMMUNITY)
Admission: RE | Admit: 2022-06-11 | Discharge: 2022-06-11 | Disposition: A | Discharge status: Home / Self Care | Payer: BC Managed Care – PPO | Source: Ambulatory Visit | Attending: Family Medicine | Admitting: Family Medicine

## 2022-06-11 ENCOUNTER — Ambulatory Visit: Payer: BC Managed Care – PPO | Admitting: Family Medicine

## 2022-06-11 ENCOUNTER — Encounter: Payer: Self-pay | Admitting: Family Medicine

## 2022-06-11 VITALS — BP 140/72 | HR 80 | Temp 98.5°F | Ht 68.0 in | Wt 155.6 lb

## 2022-06-11 DIAGNOSIS — Z87898 Personal history of other specified conditions: Secondary | ICD-10-CM | POA: Diagnosis not present

## 2022-06-11 DIAGNOSIS — R0981 Nasal congestion: Secondary | ICD-10-CM | POA: Diagnosis not present

## 2022-06-11 MED ORDER — FLUTICASONE PROPIONATE 50 MCG/ACT NA SUSP: 2.0000 | Freq: Every day | NASAL | 6 refills | Status: AC

## 2022-06-11 NOTE — Progress Notes (Signed)
Established Patient Office Visit   Subjective:  Patient ID: Luke Odonnell, male    DOB: May 29, 1955  Age: 67 y.o. MRN: [id number]  Chief Complaint  Patient presents with   Exposure to STD    Recheck on STD no symptoms would like to make sure it's gone.     Exposure to STD Pertinent negatives include no abdominal pain, dysuria, flank pain, frequency, rash or urgency.   Encounter Diagnoses  Name Primary?   History of dysuria Yes   Nasal congestion    For test of cure.  No longer symptomatic.   Review of Systems  Constitutional: Negative.   HENT: Negative.    Eyes:  Negative for blurred vision, discharge and redness.  Respiratory: Negative.    Cardiovascular: Negative.   Gastrointestinal:  Negative for abdominal pain.  Genitourinary: Negative.  Negative for dysuria, flank pain, frequency, hematuria and urgency.  Musculoskeletal: Negative.  Negative for myalgias.  Skin:  Negative for rash.  Neurological:  Negative for tingling, loss of consciousness and weakness.  Endo/Heme/Allergies:  Negative for polydipsia.     Current Outpatient Medications:    aspirin EC 81 MG tablet, Take 81 mg by mouth daily., Disp: , Rfl:    Multiple Vitamin (MULTI-VITAMIN) tablet, Take 1 tablet by mouth daily., Disp: , Rfl:    nystatin-triamcinolone ointment (MYCOLOG), Apply 1 application topically 2 (two) times daily., Disp: 60 g, Rfl: 0   PARoxetine (PAXIL) 20 MG tablet, TAKE 1 TABLET BY MOUTH EVERY DAY, Disp: 90 tablet, Rfl: 2   valsartan-hydrochlorothiazide (DIOVAN-HCT) 160-25 MG tablet, TAKE 1 TABLET BY MOUTH EVERY DAY, Disp: 90 tablet, Rfl: 0   fluticasone (FLONASE) 50 MCG/ACT nasal spray, Place 2 sprays into both nostrils daily., Disp: 16 g, Rfl: 6   Objective:     BP (!) 140/72 (BP Location: Right Arm, Patient Position: Sitting, Cuff Size: Normal)   Pulse 80   Temp 98.5 F (36.9 C) (Temporal)   Ht  (1.727 m)   Wt 155 lb 9.6 oz (70.6 kg)   SpO2 97%   BMI 23.66 kg/m  BP Readings  from Last 3 Encounters:  06/11/22 (!) 140/72  04/03/22 126/74  03/13/22 136/78   Wt Readings from Last 3 Encounters:  06/11/22 155 lb 9.6 oz (70.6 kg)  04/03/22 153 lb 12.8 oz (69.8 kg)  03/13/22 158 lb (71.7 kg)      Physical Exam Constitutional:      General: He is not in acute distress.    Appearance: Normal appearance. He is not ill-appearing, toxic-appearing or diaphoretic.  HENT:     Head: Normocephalic and atraumatic.     Right Ear: External ear normal.     Left Ear: External ear normal.  Eyes:     General: No scleral icterus.       Right eye: No discharge.        Left eye: No discharge.     Extraocular Movements: Extraocular movements intact.     Conjunctiva/sclera: Conjunctivae normal.  Pulmonary:     Effort: Pulmonary effort is normal. No respiratory distress.  Skin:    General: Skin is warm and dry.  Neurological:     Mental Status: He is alert and oriented to person, place, and time.  Psychiatric:        Mood and Affect: Mood normal.        Behavior: Behavior normal.      No results found for any visits on 06/11/22.    The 10-year ASCVD  risk score (Arnett DK, et al., 2019) is: 21%    Assessment & Plan:   History of dysuria -     Urinalysis, Routine w reflex microscopic -     Urine cytology ancillary only  Nasal congestion -     Fluticasone Propionate; Place 2 sprays into both nostrils daily.  Dispense: 16 g; Refill: 6    Return in about 6 months (around 12/11/2022), or if symptoms worsen or fail to improve.    Mliss Sax, MD

## 2022-06-12 LAB — URINALYSIS, ROUTINE W REFLEX MICROSCOPIC
Bilirubin Urine: NEGATIVE
Hgb urine dipstick: NEGATIVE
Ketones, ur: NEGATIVE
Leukocytes,Ua: NEGATIVE
Nitrite: NEGATIVE
Specific Gravity, Urine: >=1.03 — AB (ref 1.000–1.030)
Total Protein, Urine: 30 — AB
Urine Glucose: NEGATIVE
Urobilinogen, UA: 0.2 (ref 0.0–1.0)
pH: 5.5 (ref 5.0–8.0)

## 2022-06-15 LAB — URINE CYTOLOGY ANCILLARY ONLY
Chlamydia: NEGATIVE
Comment: NEGATIVE
Comment: NEGATIVE
Comment: NORMAL
Neisseria Gonorrhea: NEGATIVE
Trichomonas: NEGATIVE

## 2022-07-14 ENCOUNTER — Other Ambulatory Visit: Payer: Self-pay | Admitting: Family Medicine

## 2022-07-14 DIAGNOSIS — F325 Major depressive disorder, single episode, in full remission: Secondary | ICD-10-CM

## 2022-09-02 ENCOUNTER — Other Ambulatory Visit: Payer: Self-pay | Admitting: Family Medicine

## 2022-09-02 DIAGNOSIS — I10 Essential (primary) hypertension: Secondary | ICD-10-CM

## 2022-12-02 ENCOUNTER — Other Ambulatory Visit: Payer: Self-pay | Admitting: Family Medicine

## 2022-12-02 DIAGNOSIS — I10 Essential (primary) hypertension: Secondary | ICD-10-CM

## 2023-04-15 DIAGNOSIS — M79675 Pain in left toe(s): Secondary | ICD-10-CM | POA: Diagnosis not present

## 2023-04-15 DIAGNOSIS — M79674 Pain in right toe(s): Secondary | ICD-10-CM | POA: Diagnosis not present

## 2023-04-15 DIAGNOSIS — G8929 Other chronic pain: Secondary | ICD-10-CM | POA: Diagnosis not present

## 2023-04-23 DIAGNOSIS — M2042 Other hammer toe(s) (acquired), left foot: Secondary | ICD-10-CM | POA: Diagnosis not present

## 2023-04-23 DIAGNOSIS — M2041 Other hammer toe(s) (acquired), right foot: Secondary | ICD-10-CM | POA: Diagnosis not present

## 2023-05-11 ENCOUNTER — Other Ambulatory Visit: Payer: Self-pay | Admitting: Family Medicine

## 2023-05-11 DIAGNOSIS — I10 Essential (primary) hypertension: Secondary | ICD-10-CM

## 2023-05-14 ENCOUNTER — Other Ambulatory Visit: Payer: Self-pay | Admitting: Family Medicine

## 2023-05-14 DIAGNOSIS — I10 Essential (primary) hypertension: Secondary | ICD-10-CM

## 2023-05-14 NOTE — Telephone Encounter (Signed)
 Copied from CRM [id number]. Topic: Clinical - Medication Refill >> May 14, 2023 10:23 AM Florestine Avers wrote: Most Recent Primary Care Visit:  Provider: Mliss Sax  Department: [facility]  Visit Type: OFFICE VISIT  Date: 06/11/2022  Medication: valsartan-hydrochlorothiazide (DIOVAN-HCT) 160-25 MG tablet   Has the patient contacted their pharmacy? Yes (Agent: If no, request that the patient contact the pharmacy for the refill. If patient does not wish to contact the pharmacy document the reason why and proceed with request.) (Agent: If yes, when and what did the pharmacy advise?)  Is this the correct pharmacy for this prescription? Yes If no, delete pharmacy and type the correct one.  This is the patient's preferred pharmacy:  [id number] - [place], [place] - [address], [address] AT [facility] [address], [address] [place] [address] Phone: [phone] Fax: [phone]   Has the prescription been filled recently? No  Is the patient out of the medication? Yes  Has the patient been seen for an appointment in the last year OR does the patient have an upcoming appointment? Yes  Can we respond through MyChart? No  Agent: Please be advised that Rx refills may take up to 3 business days. We ask that you follow-up with your pharmacy.

## 2023-06-15 ENCOUNTER — Encounter: Payer: Self-pay | Admitting: Family Medicine

## 2023-06-15 ENCOUNTER — Ambulatory Visit: Admitting: Family Medicine

## 2023-06-15 VITALS — BP 128/66 | HR 70 | Temp 98.4°F | Ht 68.0 in | Wt 154.8 lb

## 2023-06-15 DIAGNOSIS — Z125 Encounter for screening for malignant neoplasm of prostate: Secondary | ICD-10-CM

## 2023-06-15 DIAGNOSIS — I10 Essential (primary) hypertension: Secondary | ICD-10-CM

## 2023-06-15 DIAGNOSIS — Z13220 Encounter for screening for lipoid disorders: Secondary | ICD-10-CM

## 2023-06-15 DIAGNOSIS — Z0000 Encounter for general adult medical examination without abnormal findings: Secondary | ICD-10-CM | POA: Diagnosis not present

## 2023-06-15 DIAGNOSIS — E611 Iron deficiency: Secondary | ICD-10-CM | POA: Diagnosis not present

## 2023-06-15 DIAGNOSIS — Z131 Encounter for screening for diabetes mellitus: Secondary | ICD-10-CM

## 2023-06-15 DIAGNOSIS — R319 Hematuria, unspecified: Secondary | ICD-10-CM

## 2023-06-15 DIAGNOSIS — F325 Major depressive disorder, single episode, in full remission: Secondary | ICD-10-CM

## 2023-06-15 MED ORDER — VALSARTAN-HYDROCHLOROTHIAZIDE 160-25 MG PO TABS
1.0000 | ORAL_TABLET | Freq: Every day | ORAL | 0 refills | Status: DC
Start: 2023-06-15 — End: 2023-09-10

## 2023-06-15 MED ORDER — PAROXETINE HCL 20 MG PO TABS: 20.0000 mg | ORAL_TABLET | Freq: Every day | ORAL | 2 refills | Status: DC

## 2023-06-15 NOTE — Progress Notes (Addendum)
 Established Patient Office Visit   Subjective:  Patient ID: Luke Odonnell, male    DOB: August 22, 1955  Age: 68 y.o. MRN: [id number]  Chief Complaint  Patient presents with   Hypertension    f/u HTN.  BP 128/78 - 134/80 average at home.     Hypertension Pertinent negatives include no blurred vision.   Encounter Diagnoses  Name Primary?   Healthcare maintenance Yes   Depression, major, single episode, complete remission (HCC)    Essential hypertension    Screening for diabetes mellitus    Screening for cholesterol level    Screening for prostate cancer    Iron deficiency    Hematuria, unspecified type    For visit: Follow-up of hypertension and depression.  Continues valsartan  with HCTZ for well-controlled hypertension.  Continues paroxetine  for depression.  It works for him.  Continues to work full-time with 50 hours plus some weight.   Review of Systems  Constitutional: Negative.   HENT: Negative.    Eyes:  Negative for blurred vision, discharge and redness.  Respiratory: Negative.    Cardiovascular: Negative.   Gastrointestinal:  Negative for abdominal pain, blood in stool and melena.  Genitourinary: Negative.   Musculoskeletal: Negative.  Negative for myalgias.  Skin:  Negative for rash.  Neurological:  Negative for tingling, loss of consciousness and weakness.  Endo/Heme/Allergies:  Negative for polydipsia.      06/15/2023    3:38 PM 06/11/2022    3:40 PM 05/04/2022    4:14 PM  Depression screen PHQ 2/9  Decreased Interest 0 0 0  Down, Depressed, Hopeless 0 0 0  PHQ - 2 Score 0 0 0  Altered sleeping 0    Tired, decreased energy 3    Change in appetite 0    Feeling bad or failure about yourself  0    Trouble concentrating 0    Moving slowly or fidgety/restless 0    Suicidal thoughts 0    PHQ-9 Score 3    Difficult doing work/chores Not difficult at all        Current Outpatient Medications:    aspirin EC 81 MG tablet, Take 81 mg by mouth daily. Every other  day, Disp: , Rfl:    fluticasone  (FLONASE ) 50 MCG/ACT nasal spray, Place 2 sprays into both nostrils daily., Disp: 16 g, Rfl: 6   Multiple Vitamin (MULTI-VITAMIN) tablet, Take 1 tablet by mouth daily., Disp: , Rfl:    nystatin -triamcinolone  ointment (MYCOLOG), Apply 1 application topically 2 (two) times daily., Disp: 60 g, Rfl: 0   PARoxetine  (PAXIL ) 20 MG tablet, Take 1 tablet (20 mg total) by mouth daily., Disp: 90 tablet, Rfl: 2   valsartan -hydrochlorothiazide  (DIOVAN -HCT) 160-25 MG tablet, Take 1 tablet by mouth daily., Disp: 90 tablet, Rfl: 0   Objective:     BP 128/66   Pulse 70   Temp 98.4 F (36.9 C) (Temporal)   Ht 5\' 8"  (1.727 m)   Wt 154 lb 12.8 oz (70.2 kg)   SpO2 97%   BMI 23.54 kg/m    Physical Exam Constitutional:      General: He is not in acute distress.    Appearance: Normal appearance. He is not ill-appearing, toxic-appearing or diaphoretic.  HENT:     Head: Normocephalic and atraumatic.     Right Ear: Tympanic membrane, ear canal and external ear normal.     Left Ear: Tympanic membrane, ear canal and external ear normal.     Mouth/Throat:     Mouth:  Mucous membranes are moist.     Pharynx: Oropharynx is clear. No oropharyngeal exudate or posterior oropharyngeal erythema.  Eyes:     General: No scleral icterus.       Right eye: No discharge.        Left eye: No discharge.     Extraocular Movements: Extraocular movements intact.     Conjunctiva/sclera: Conjunctivae normal.     Pupils: Pupils are equal, round, and reactive to light.  Cardiovascular:     Rate and Rhythm: Normal rate and regular rhythm.  Pulmonary:     Effort: Pulmonary effort is normal. No respiratory distress.     Breath sounds: Normal breath sounds.  Abdominal:     General: Bowel sounds are normal.     Tenderness: There is no abdominal tenderness. There is no guarding.     Hernia: There is no hernia in the left inguinal area or right inguinal area.  Genitourinary:    Penis:  Uncircumcised. No phimosis, paraphimosis, hypospadias, erythema, tenderness, discharge, swelling or lesions.      Testes:        Right: Mass, tenderness or swelling not present. Right testis is descended.        Left: Mass, tenderness or swelling not present. Left testis is descended.     Epididymis:     Right: Not inflamed or enlarged.     Left: Not inflamed or enlarged.     Prostate: Not enlarged, not tender and no nodules present.     Rectum: Guaiac result negative. No mass, tenderness, anal fissure, external hemorrhoid or internal hemorrhoid. Normal anal tone.  Musculoskeletal:     Cervical back: No rigidity or tenderness.  Lymphadenopathy:     Cervical: No cervical adenopathy.     Lower Body: No right inguinal adenopathy. No left inguinal adenopathy.  Skin:    General: Skin is warm and dry.  Neurological:     Mental Status: He is alert and oriented to person, place, and time.  Psychiatric:        Mood and Affect: Mood normal.        Behavior: Behavior normal.      Results for orders placed or performed in visit on 06/15/23  CBC with Differential/Platelet  Result Value Ref Range   WBC 8.7 4.0 - 10.5 K/uL   RBC 3.60 (L) 4.22 - 5.81 Mil/uL   Hemoglobin 11.0 (L) 13.0 - 17.0 g/dL   HCT 69.6 (L) 29.5 - 28.4 %   MCV 89.7 78.0 - 100.0 fl   MCHC 34.2 30.0 - 36.0 g/dL   RDW 13.2 44.0 - 10.2 %   Platelets 481.0 (H) 150.0 - 400.0 K/uL   Neutrophils Relative % 62.9 43.0 - 77.0 %   Lymphocytes Relative 25.5 12.0 - 46.0 %   Monocytes Relative 9.4 3.0 - 12.0 %   Eosinophils Relative 1.2 0.0 - 5.0 %   Basophils Relative 1.0 0.0 - 3.0 %   Neutro Abs 5.5 1.4 - 7.7 K/uL   Lymphs Abs 2.2 0.7 - 4.0 K/uL   Monocytes Absolute 0.8 0.1 - 1.0 K/uL   Eosinophils Absolute 0.1 0.0 - 0.7 K/uL   Basophils Absolute 0.1 0.0 - 0.1 K/uL  Comprehensive metabolic panel with GFR  Result Value Ref Range   Sodium 139 135 - 145 mEq/L   Potassium 4.2 3.5 - 5.1 mEq/L   Chloride 101 96 - 112 mEq/L   CO2  30 19 - 32 mEq/L   Glucose, Bld 89 70 - 99  mg/dL   BUN 27 (H) 6 - 23 mg/dL   Creatinine, Ser 7.84 0.40 - 1.50 mg/dL   Total Bilirubin 0.5 0.2 - 1.2 mg/dL   Alkaline Phosphatase 98 39 - 117 U/L   AST 17 0 - 37 U/L   ALT 13 0 - 53 U/L   Total Protein 7.2 6.0 - 8.3 g/dL   Albumin 4.1 3.5 - 5.2 g/dL   GFR 69.62 (L) >95.28 mL/min   Calcium 9.1 8.4 - 10.5 mg/dL  Lipid panel  Result Value Ref Range   Cholesterol 161 0 - 200 mg/dL   Triglycerides 41.3 0.0 - 149.0 mg/dL   HDL 24.40 (L) >10.27 mg/dL   VLDL 25.3 0.0 - 66.4 mg/dL   LDL Cholesterol 403 (H) 0 - 99 mg/dL   Total CHOL/HDL Ratio 4    NonHDL 123.86   PSA  Result Value Ref Range   PSA 0.49 0.10 - 4.00 ng/mL  Urinalysis, Routine w reflex microscopic  Result Value Ref Range   Color, Urine YELLOW Yellow;Lt. Yellow;Straw;Dark Yellow;Amber;Green;Red;Brown   APPearance CLEAR Clear;Turbid;Slightly Cloudy;Cloudy   Specific Gravity, Urine 1.025 1.000 - 1.030   pH 6.0 5.0 - 8.0   Total Protein, Urine 30 (A) Negative   Urine Glucose NEGATIVE Negative   Ketones, ur NEGATIVE Negative   Bilirubin Urine NEGATIVE Negative   Hgb urine dipstick SMALL (A) Negative   Urobilinogen, UA 0.2 0.0 - 1.0   Leukocytes,Ua NEGATIVE Negative   Nitrite NEGATIVE Negative   WBC, UA 0-2/hpf 0-2/hpf   RBC / HPF 7-10/hpf (A) 0-2/hpf   Mucus, UA Presence of (A) None   Squamous Epithelial / HPF Rare(0-4/hpf) Rare(0-4/hpf)   Sperm, UA Presence of (A) None  Hemoglobin A1c  Result Value Ref Range   Hgb A1c MFr Bld 6.5 4.6 - 6.5 %  Iron, TIBC and Ferritin Panel  Result Value Ref Range   Iron 56 50 - 180 mcg/dL   TIBC 474 259 - 563 mcg/dL (calc)   %SAT 22 20 - 48 % (calc)   Ferritin 369 24 - 380 ng/mL      The 10-year ASCVD risk score (Arnett DK, et al., 2019) is: 21.4%    Assessment & Plan:   Healthcare maintenance -     Urinalysis, Routine w reflex microscopic  Depression, major, single episode, complete remission (HCC) -     PARoxetine  HCl;  Take 1 tablet (20 mg total) by mouth daily.  Dispense: 90 tablet; Refill: 2  Essential hypertension -     Valsartan -hydroCHLOROthiazide ; Take 1 tablet by mouth daily.  Dispense: 90 tablet; Refill: 0  Screening for diabetes mellitus -     Comprehensive metabolic panel with GFR -     Hemoglobin A1c  Screening for cholesterol level -     Comprehensive metabolic panel with GFR -     Lipid panel  Screening for prostate cancer -     PSA  Iron deficiency -     CBC with Differential/Platelet -     Iron, TIBC and Ferritin Panel  Hematuria, unspecified type -     Urinalysis, Routine w reflex microscopic; Future    Return in about 3 months (around 09/14/2023).  Continue Diovan  HCT for hypertension and Paxil  20 mg daily for depression.  Further recommendations made pending results of labs.  Information on health maintenance and disease prevention was given.  Tonna Frederic, MD  5/1 addendum: A1c came back elevated into the prediabetic/diabetic range.  There was a small amount of  hematuria.  As the patient to return after hydrating well for repeat urinalysis and again in 3 months for recheck of A1c and urine.

## 2023-06-16 LAB — CBC WITH DIFFERENTIAL/PLATELET
Basophils Absolute: 0.1 10*3/uL (ref 0.0–0.1)
Basophils Relative: 1 % (ref 0.0–3.0)
Eosinophils Absolute: 0.1 10*3/uL (ref 0.0–0.7)
Eosinophils Relative: 1.2 % (ref 0.0–5.0)
HCT: 32.3 % — ABNORMAL LOW (ref 39.0–52.0)
Hemoglobin: 11 g/dL — ABNORMAL LOW (ref 13.0–17.0)
Lymphocytes Relative: 25.5 % (ref 12.0–46.0)
Lymphs Abs: 2.2 10*3/uL (ref 0.7–4.0)
MCHC: 34.2 g/dL (ref 30.0–36.0)
MCV: 89.7 fl (ref 78.0–100.0)
Monocytes Absolute: 0.8 10*3/uL (ref 0.1–1.0)
Monocytes Relative: 9.4 % (ref 3.0–12.0)
Neutro Abs: 5.5 10*3/uL (ref 1.4–7.7)
Neutrophils Relative %: 62.9 % (ref 43.0–77.0)
Platelets: 481 10*3/uL — ABNORMAL HIGH (ref 150.0–400.0)
RBC: 3.6 Mil/uL — ABNORMAL LOW (ref 4.22–5.81)
RDW: 13.7 % (ref 11.5–15.5)
WBC: 8.7 10*3/uL (ref 4.0–10.5)

## 2023-06-16 LAB — URINALYSIS, ROUTINE W REFLEX MICROSCOPIC
Bilirubin Urine: NEGATIVE
Ketones, ur: NEGATIVE
Leukocytes,Ua: NEGATIVE
Nitrite: NEGATIVE
Specific Gravity, Urine: 1.025 (ref 1.000–1.030)
Total Protein, Urine: 30 — AB
Urine Glucose: NEGATIVE
Urobilinogen, UA: 0.2 (ref 0.0–1.0)
pH: 6 (ref 5.0–8.0)

## 2023-06-16 LAB — LIPID PANEL
Cholesterol: 161 mg/dL (ref 0–200)
HDL: 36.7 mg/dL — ABNORMAL LOW (ref >39.00)
LDL Cholesterol: 104 mg/dL — ABNORMAL HIGH (ref 0–99)
NonHDL: 123.86
Total CHOL/HDL Ratio: 4
Triglycerides: 99 mg/dL (ref 0.0–149.0)
VLDL: 19.8 mg/dL (ref 0.0–40.0)

## 2023-06-16 LAB — COMPREHENSIVE METABOLIC PANEL WITH GFR
ALT: 13 U/L (ref 0–53)
AST: 17 U/L (ref 0–37)
Albumin: 4.1 g/dL (ref 3.5–5.2)
Alkaline Phosphatase: 98 U/L (ref 39–117)
BUN: 27 mg/dL — ABNORMAL HIGH (ref 6–23)
CO2: 30 meq/L (ref 19–32)
Calcium: 9.1 mg/dL (ref 8.4–10.5)
Chloride: 101 meq/L (ref 96–112)
Creatinine, Ser: 1.4 mg/dL (ref 0.40–1.50)
GFR: 51.09 mL/min — ABNORMAL LOW (ref >60.00)
Glucose, Bld: 89 mg/dL (ref 70–99)
Potassium: 4.2 meq/L (ref 3.5–5.1)
Sodium: 139 meq/L (ref 135–145)
Total Bilirubin: 0.5 mg/dL (ref 0.2–1.2)
Total Protein: 7.2 g/dL (ref 6.0–8.3)

## 2023-06-16 LAB — IRON,TIBC AND FERRITIN PANEL
%SAT: 22 % (ref 20–48)
Ferritin: 369 ng/mL (ref 24–380)
Iron: 56 ug/dL (ref 50–180)
TIBC: 257 ug/dL (ref 250–425)

## 2023-06-16 LAB — PSA: PSA: 0.49 ng/mL (ref 0.10–4.00)

## 2023-06-16 LAB — HEMOGLOBIN A1C: Hgb A1c MFr Bld: 6.5 % (ref 4.6–6.5)

## 2023-06-17 ENCOUNTER — Encounter: Payer: Self-pay | Admitting: Family Medicine

## 2023-06-17 DIAGNOSIS — R319 Hematuria, unspecified: Secondary | ICD-10-CM | POA: Insufficient documentation

## 2023-06-17 NOTE — Addendum Note (Signed)
 Addended by: Delene Feinstein on: 06/17/2023 12:13 PM   Modules accepted: Orders

## 2023-06-21 ENCOUNTER — Telehealth: Payer: Self-pay

## 2023-06-21 NOTE — Telephone Encounter (Signed)
 Copied from CRM [id number]. Topic: Clinical - Lab/Test Results >> Jun 21, 2023  4:49 PM Magdalene School wrote: Reason for CRM: Patient called back for lab results. I relayed the results as stated in the notes and scheduled a 3 month follow up appointment on 09/14/23.

## 2023-07-05 DIAGNOSIS — M2041 Other hammer toe(s) (acquired), right foot: Secondary | ICD-10-CM | POA: Diagnosis not present

## 2023-07-21 DIAGNOSIS — M898X7 Other specified disorders of bone, ankle and foot: Secondary | ICD-10-CM | POA: Diagnosis not present

## 2023-07-21 DIAGNOSIS — M2041 Other hammer toe(s) (acquired), right foot: Secondary | ICD-10-CM | POA: Diagnosis not present

## 2023-07-26 DIAGNOSIS — M2041 Other hammer toe(s) (acquired), right foot: Secondary | ICD-10-CM | POA: Diagnosis not present

## 2023-08-10 DIAGNOSIS — Z4781 Encounter for orthopedic aftercare following surgical amputation: Secondary | ICD-10-CM | POA: Diagnosis not present

## 2023-08-10 DIAGNOSIS — M79671 Pain in right foot: Secondary | ICD-10-CM | POA: Diagnosis not present

## 2023-08-10 DIAGNOSIS — Z89421 Acquired absence of other right toe(s): Secondary | ICD-10-CM | POA: Diagnosis not present

## 2023-08-30 DIAGNOSIS — M2041 Other hammer toe(s) (acquired), right foot: Secondary | ICD-10-CM | POA: Diagnosis not present

## 2023-09-10 ENCOUNTER — Other Ambulatory Visit: Payer: Self-pay | Admitting: Family Medicine

## 2023-09-10 DIAGNOSIS — I10 Essential (primary) hypertension: Secondary | ICD-10-CM

## 2023-09-14 ENCOUNTER — Encounter: Payer: Self-pay | Admitting: Family Medicine

## 2023-09-14 ENCOUNTER — Ambulatory Visit: Admitting: Family Medicine

## 2023-09-14 VITALS — BP 138/70 | HR 64 | Temp 97.1°F | Ht 68.0 in | Wt 158.0 lb

## 2023-09-14 DIAGNOSIS — R319 Hematuria, unspecified: Secondary | ICD-10-CM | POA: Diagnosis not present

## 2023-09-14 DIAGNOSIS — R7303 Prediabetes: Secondary | ICD-10-CM | POA: Diagnosis not present

## 2023-09-14 NOTE — Progress Notes (Signed)
 Established Patient Office Visit   Subjective:  Patient ID: Luke Odonnell, male    DOB: 10/14/1955  Age: 68 y.o. MRN: [id number]  Chief Complaint  Patient presents with   Medical Management of Chronic Issues    3 month follow up. Pt is fasting. Recheck U/A and A1C.     HPI Encounter Diagnoses  Name Primary?   Hematuria, unspecified type Yes   Prediabetes    For follow-up of hematuria and elevated A1c.  No visible blood in the urine.  No history of renal lithiasis.  No history of tobacco use.  He does have iron deficiency.  No history of diabetes.  He does enjoy his sweets.  He drinks 3 Dr. Buster daily and eats candy in the evening.  He is healthy and quite active physically.   Review of Systems  Constitutional: Negative.   HENT: Negative.    Eyes:  Negative for blurred vision, discharge and redness.  Respiratory: Negative.    Cardiovascular: Negative.   Gastrointestinal:  Negative for abdominal pain.  Genitourinary: Negative.   Musculoskeletal: Negative.  Negative for myalgias.  Skin:  Negative for rash.  Neurological:  Negative for tingling, loss of consciousness and weakness.  Endo/Heme/Allergies:  Negative for polydipsia.     Current Outpatient Medications:    aspirin EC 81 MG tablet, Take 81 mg by mouth daily. Every other day, Disp: , Rfl:    fluticasone  (FLONASE ) 50 MCG/ACT nasal spray, Place 2 sprays into both nostrils daily., Disp: 16 g, Rfl: 6   Multiple Vitamin (MULTI-VITAMIN) tablet, Take 1 tablet by mouth daily., Disp: , Rfl:    nystatin -triamcinolone  ointment (MYCOLOG), Apply 1 application topically 2 (two) times daily., Disp: 60 g, Rfl: 0   PARoxetine  (PAXIL ) 20 MG tablet, Take 1 tablet (20 mg total) by mouth daily., Disp: 90 tablet, Rfl: 2   valsartan -hydrochlorothiazide  (DIOVAN -HCT) 160-25 MG tablet, TAKE 1 TABLET BY MOUTH EVERY DAY, Disp: 90 tablet, Rfl: 0   Objective:     BP 138/70 (Cuff Size: Normal)   Pulse 64   Temp (!) 97.1 F (36.2 C)  (Temporal)   Ht 5' 8 (1.727 m)   Wt 158 lb (71.7 kg)   SpO2 97%   BMI 24.02 kg/m    Physical Exam Constitutional:      General: He is not in acute distress.    Appearance: Normal appearance. He is not ill-appearing, toxic-appearing or diaphoretic.  HENT:     Head: Normocephalic and atraumatic.     Right Ear: External ear normal.     Left Ear: External ear normal.  Eyes:     General: No scleral icterus.       Right eye: No discharge.        Left eye: No discharge.     Extraocular Movements: Extraocular movements intact.     Conjunctiva/sclera: Conjunctivae normal.  Pulmonary:     Effort: Pulmonary effort is normal. No respiratory distress.  Skin:    General: Skin is warm and dry.  Neurological:     Mental Status: He is alert and oriented to person, place, and time.  Psychiatric:        Mood and Affect: Mood normal.        Behavior: Behavior normal.      Results for orders placed or performed in visit on 09/14/23  Hemoglobin A1c  Result Value Ref Range   Hgb A1c MFr Bld 6.4 4.6 - 6.5 %  Urinalysis, Routine w reflex microscopic  Result  Value Ref Range   Color, Urine YELLOW Yellow;Lt. Yellow;Straw;Dark Yellow;Amber;Green;Red;Brown   APPearance Sl Cloudy (A) Clear;Turbid;Slightly Cloudy;Cloudy   Specific Gravity, Urine 1.025 1.000 - 1.030   pH 6.0 5.0 - 8.0   Total Protein, Urine 100 (A) Negative   Urine Glucose NEGATIVE Negative   Ketones, ur NEGATIVE Negative   Bilirubin Urine NEGATIVE Negative   Hgb urine dipstick MODERATE (A) Negative   Urobilinogen, UA 0.2 0.0 - 1.0   Leukocytes,Ua NEGATIVE Negative   Nitrite NEGATIVE Negative   WBC, UA 0-2/hpf 0-2/hpf   RBC / HPF 7-10/hpf (A) 0-2/hpf   Mucus, UA Presence of (A) None   Squamous Epithelial / HPF Rare(0-4/hpf) Rare(0-4/hpf)   Hyaline Casts, UA Presence of (A) None   Amorphous Present (A) None;Present      The 10-year ASCVD risk score (Arnett DK, et al., 2019) is: 24.1%    Assessment & Plan:    Hematuria, unspecified type -     Urinalysis, Routine w reflex microscopic -     Ambulatory referral to Urology  Prediabetes -     Hemoglobin A1c    Return in about 3 months (around 12/15/2023).  Information was given on preventing diabetes.  He will moderate intake of sweets and sweet drinks.  Urology referral with persisting hematuria.  Elsie Sim Lent, MD

## 2023-09-15 LAB — HEMOGLOBIN A1C: Hgb A1c MFr Bld: 6.4 % (ref 4.6–6.5)

## 2023-09-15 LAB — URINALYSIS, ROUTINE W REFLEX MICROSCOPIC
Bilirubin Urine: NEGATIVE
Ketones, ur: NEGATIVE
Leukocytes,Ua: NEGATIVE
Nitrite: NEGATIVE
Specific Gravity, Urine: 1.025 (ref 1.000–1.030)
Total Protein, Urine: 100 — AB
Urine Glucose: NEGATIVE
Urobilinogen, UA: 0.2 (ref 0.0–1.0)
pH: 6 (ref 5.0–8.0)

## 2023-09-16 ENCOUNTER — Ambulatory Visit: Payer: Self-pay | Admitting: Family Medicine

## 2023-09-16 NOTE — Addendum Note (Signed)
 Addended by: BERNETA ELSIE LABOR on: 09/16/2023 04:06 PM   Modules accepted: Orders

## 2023-10-27 NOTE — Progress Notes (Signed)
    Chief Complaint: Blood in urine  History of Present Illness:  Luke Odonnell is a 68 y.o. male who is seen in consultation from Berneta Elsie Sayre, MD for evaluation of microscopic hematuria.  He has no significant history of tobacco use.  No gross hematuria.  No lower urinary tract symptomatology.  He has no prior urologic history.  Past Medical History:  Past Medical History:  Diagnosis Date   Abnormal SPEP    Adenomatous polyp of colon 2003   Decreased sex drive    Depression    Dysgeusia    Hiatal hernia    Hypertension    Iron deficiency anemia    Thiamine  deficiency     Past Surgical History:  Past Surgical History:  Procedure Laterality Date   BACK SURGERY     x 4: neck and lower back fusions    Allergies:  Allergies  Allergen Reactions   Neurontin [Gabapentin] Swelling    Dropped BP, in hospital for 3 days.    Family History:  Family History  Problem Relation Age of Onset   Colonic polyp Father    Stroke Father    Esophageal cancer Neg Hx    Colon cancer Neg Hx    Rectal cancer Neg Hx    Stomach cancer Neg Hx     Social History:  Social History   Tobacco Use   Smoking status: Never   Smokeless tobacco: Never  Vaping Use   Vaping status: Never Used  Substance Use Topics   Alcohol use: Yes    Alcohol/week: 1.0 standard drink of alcohol    Types: 1 Cans of beer per week   Drug use: Not Currently    Review of symptoms:  Constitutional:  Negative for unexplained weight loss, night sweats, fever, chills ENT:  Negative for nose bleeds, sinus pain, painful swallowing CV:  Negative for chest pain, shortness of breath, exercise intolerance, palpitations, loss of consciousness Resp:  Negative for cough, wheezing, shortness of breath GI:  Negative for nausea, vomiting, diarrhea, bloody stools GU:  Positives noted in HPI; otherwise negative for gross hematuria, dysuria, urinary incontinence Neuro:  Negative for seizures, poor balance, limb  weakness, slurred speech Psych:  Negative for lack of energy, depression, anxiety Endocrine:  Negative for polydipsia, polyuria, symptoms of hypoglycemia (dizziness, hunger, sweating) Hematologic:  Negative for anemia, purpura, petechia, prolonged or excessive bleeding, use of anticoagulants  Allergic:  Negative for difficulty breathing or choking as a result of exposure to anything; no shellfish allergy; no allergic response (rash/itch) to materials, foods  Physical exam: There were no vitals taken for this visit. GENERAL APPEARANCE:  Well appearing, well developed, well nourished, NAD HEENT: Atraumatic, Normocephalic. NECK: Normal appearance LUNGS: Normal inspiratory and expiratory excursion HEART: Regular Rate ABDOMEN: No inguinal hernias. GU: Phallus uncircumcised, normal, no lesions. Scrotal skin normal. Testicles/epididymal structures normal. Meatus normal. Normal anal sphincter tone, prostate 20 mL, symmetric, non nodular, non tender. EXTREMITIES: Moves all extremities well.  Without clubbing, cyanosis, or edema. NEUROLOGIC:  Alert and oriented x 3, normal gait, CN II-XII grossly intact.  MENTAL STATUS:  Appropriate. SKIN:  Warm, dry and intact.    Results:  I have reviewed referring/prior physicians notes  I have reviewed urinalysis  I have reviewed PSA results--2.49 and April of this year   Assessment: Persistent microscopic hematuria and relatively low risk individual   Plan: We will work on setting up hematuria CT followed by cystoscopy

## 2023-11-01 ENCOUNTER — Ambulatory Visit: Admitting: Urology

## 2023-11-01 VITALS — BP 182/79 | HR 68 | Ht 68.0 in | Wt 156.0 lb

## 2023-11-01 DIAGNOSIS — R3129 Other microscopic hematuria: Secondary | ICD-10-CM

## 2023-11-01 LAB — URINALYSIS, ROUTINE W REFLEX MICROSCOPIC
Bilirubin, UA: NEGATIVE
Glucose, UA: NEGATIVE
Ketones, UA: NEGATIVE
Nitrite, UA: NEGATIVE
Protein,UA: NEGATIVE
RBC, UA: NEGATIVE
Specific Gravity, UA: 1.01 (ref 1.005–1.030)
Urobilinogen, Ur: 0.2 mg/dL (ref 0.2–1.0)
pH, UA: 5.5 (ref 5.0–7.5)

## 2023-11-01 LAB — MICROSCOPIC EXAMINATION

## 2023-11-07 ENCOUNTER — Ambulatory Visit (HOSPITAL_BASED_OUTPATIENT_CLINIC_OR_DEPARTMENT_OTHER)
Admission: RE | Admit: 2023-11-07 | Discharge: 2023-11-07 | Disposition: A | Discharge status: Home / Self Care | Source: Ambulatory Visit | Attending: Urology | Admitting: Urology

## 2023-11-07 DIAGNOSIS — N200 Calculus of kidney: Secondary | ICD-10-CM | POA: Diagnosis not present

## 2023-11-07 DIAGNOSIS — D1803 Hemangioma of intra-abdominal structures: Secondary | ICD-10-CM | POA: Diagnosis not present

## 2023-11-07 DIAGNOSIS — K4020 Bilateral inguinal hernia, without obstruction or gangrene, not specified as recurrent: Secondary | ICD-10-CM | POA: Diagnosis not present

## 2023-11-07 DIAGNOSIS — R3129 Other microscopic hematuria: Secondary | ICD-10-CM | POA: Diagnosis not present

## 2023-11-07 LAB — POCT I-STAT CREATININE: Creatinine, Ser: 1.5 mg/dL — ABNORMAL HIGH (ref 0.61–1.24)

## 2023-11-07 MED ORDER — IOHEXOL 300 MG/ML  SOLN
100.0000 mL | Freq: Once | INTRAMUSCULAR | Status: AC | PRN
  Administered 2023-11-07: 100 mL via INTRAVENOUS

## 2023-11-24 DIAGNOSIS — S0502XA Injury of conjunctiva and corneal abrasion without foreign body, left eye, initial encounter: Secondary | ICD-10-CM | POA: Diagnosis not present

## 2023-11-24 NOTE — Progress Notes (Signed)
   History of Present Illness: 68 yo male here for cysto for evaluation of persistent microscopic hematuria.  Recent hematuria protocol CT:  IMPRESSION: 1. There are at least 2 nonobstructing calculi in the left kidney with largest measuring up to 5 x 5 mm. No other nephroureterolithiasis on either side. No obstructive uropathy. No suspicious renal, ureteric or urinary bladder mass. 2. Multiple other nonacute observations, as described above.     Past Medical History:  Diagnosis Date   Abnormal SPEP    Adenomatous polyp of colon 2003   Decreased sex drive    Depression    Dysgeusia    Hiatal hernia    Hypertension    Iron deficiency anemia    Thiamine  deficiency     Past Surgical History:  Procedure Laterality Date   BACK SURGERY     x 4: neck and lower back fusions    Home Medications:  Allergies as of 11/29/2023       Reactions   Neurontin [gabapentin] Swelling   Dropped BP, in hospital for 3 days.        Medication List        Accurate as of November 28, 2023  9:25 AM. If you have any questions, ask your nurse or doctor.          aspirin EC 81 MG tablet Take 81 mg by mouth daily. Every other day   fluticasone  50 MCG/ACT nasal spray Commonly known as: FLONASE  Place 2 sprays into both nostrils daily.   Multi-Vitamin tablet Take 1 tablet by mouth daily.   nystatin -triamcinolone  ointment Commonly known as: MYCOLOG Apply 1 application topically 2 (two) times daily.   PARoxetine  20 MG tablet Commonly known as: PAXIL  Take 1 tablet (20 mg total) by mouth daily.   valsartan -hydrochlorothiazide  160-25 MG tablet Commonly known as: DIOVAN -HCT TAKE 1 TABLET BY MOUTH EVERY DAY        Allergies:  Allergies  Allergen Reactions   Neurontin [Gabapentin] Swelling    Dropped BP, in hospital for 3 days.    Family History  Problem Relation Age of Onset   Colonic polyp Father    Stroke Father    Esophageal cancer Neg Hx    Colon cancer Neg Hx     Rectal cancer Neg Hx    Stomach cancer Neg Hx     Social History:  reports that he has never smoked. He has never used smokeless tobacco. He reports current alcohol use of about 1.0 standard drink of alcohol per week. He reports that he does not currently use drugs.  Urinalysis reveals persistent microscopic hematuria  CT images independently reviewed  Cystoscopy Procedure Note:  Indication: Microscopic hematuria  After informed consent and discussion of the procedure and its risks, Luke Odonnell was positioned and prepped in the standard fashion.  Cystoscopy was performed with a flexible cystoscope.   Findings: Urethra: No stricture, no lesion Prostate: Mild obstruction from bilobar hyperplasia Bladder neck: Open Ureteral orifices: Orthotopic bilaterally, normal configuration Bladder: Mild trabeculation.  No tumors or foreign bodies.  The patient tolerated the procedure well.      Impression/Assessment:  Microscopic hematuria with negative evaluation except for left sided renal calculi, asymptomatic  Plan:  Reassured about his exam  I do not think we need to do anything about his stones  Return as needed

## 2023-11-29 ENCOUNTER — Ambulatory Visit: Admitting: Urology

## 2023-11-29 VITALS — BP 170/90 | HR 73 | Ht 68.0 in | Wt 165.0 lb

## 2023-11-29 DIAGNOSIS — R3129 Other microscopic hematuria: Secondary | ICD-10-CM | POA: Diagnosis not present

## 2023-11-29 DIAGNOSIS — N200 Calculus of kidney: Secondary | ICD-10-CM | POA: Diagnosis not present

## 2023-11-29 DIAGNOSIS — S0502XA Injury of conjunctiva and corneal abrasion without foreign body, left eye, initial encounter: Secondary | ICD-10-CM | POA: Diagnosis not present

## 2023-11-29 LAB — MICROSCOPIC EXAMINATION

## 2023-11-29 LAB — URINALYSIS, ROUTINE W REFLEX MICROSCOPIC
Bilirubin, UA: NEGATIVE
Glucose, UA: NEGATIVE
Ketones, UA: NEGATIVE
Leukocytes,UA: NEGATIVE
Nitrite, UA: NEGATIVE
Specific Gravity, UA: 1.025 (ref 1.005–1.030)
Urobilinogen, Ur: 0.2 mg/dL (ref 0.2–1.0)
pH, UA: 5.5 (ref 5.0–7.5)

## 2023-11-29 MED ORDER — CIPROFLOXACIN HCL 500 MG PO TABS
500.0000 mg | ORAL_TABLET | Freq: Once | ORAL | Status: AC
  Administered 2023-11-29: 500 mg via ORAL

## 2023-11-29 NOTE — Addendum Note (Signed)
 Addended by: KOLEEN CREE C on: 11/29/2023 03:05 PM   Modules accepted: Orders

## 2023-12-06 DIAGNOSIS — S0502XD Injury of conjunctiva and corneal abrasion without foreign body, left eye, subsequent encounter: Secondary | ICD-10-CM | POA: Diagnosis not present

## 2023-12-16 ENCOUNTER — Ambulatory Visit: Admitting: Family Medicine

## 2023-12-16 ENCOUNTER — Encounter: Payer: Self-pay | Admitting: Family Medicine

## 2023-12-16 VITALS — BP 126/76 | HR 72 | Temp 97.9°F | Ht 68.0 in | Wt 164.8 lb

## 2023-12-16 DIAGNOSIS — R7303 Prediabetes: Secondary | ICD-10-CM

## 2023-12-16 DIAGNOSIS — I10 Essential (primary) hypertension: Secondary | ICD-10-CM

## 2023-12-16 DIAGNOSIS — N029 Recurrent and persistent hematuria with unspecified morphologic changes: Secondary | ICD-10-CM

## 2023-12-16 DIAGNOSIS — E611 Iron deficiency: Secondary | ICD-10-CM | POA: Diagnosis not present

## 2023-12-16 NOTE — Progress Notes (Unsigned)
 Established Patient Office Visit   Subjective:  Patient ID: Luke Odonnell, male    DOB: 11/30/1955  Age: 68 y.o. MRN: [id number]  Chief Complaint  Patient presents with   Medical Management of Chronic Issues    3 month follow up. Pt is fasting. Pt has his right pinky toe amputated in August.     HPI Encounter Diagnoses  Name Primary?   Essential hypertension Yes   Prediabetes    Iron deficiency    Benign hematuria    Continue his Diovan  for well-controlled hypertension.  Has been more mindful of the intake of simple carbohydrates.  Workup for hematuria was negative.  He continues on iron sulfate 25 mg every other day.  Continues Paxil .  He had hoped to care for a friend who had had a severe stroke and then help to care for his wife.  The family sold him the house at a discounted rate that he is able to afford.  He has been able to move out of a trailer park. {History (Optional):23778}  Review of Systems  Constitutional: Negative.   HENT: Negative.    Eyes:  Negative for blurred vision, discharge and redness.  Respiratory: Negative.    Cardiovascular: Negative.   Gastrointestinal:  Negative for abdominal pain.  Genitourinary: Negative.   Musculoskeletal: Negative.  Negative for myalgias.  Skin:  Negative for rash.  Neurological:  Negative for tingling, loss of consciousness and weakness.  Endo/Heme/Allergies:  Negative for polydipsia.     Current Outpatient Medications:    fluticasone  (FLONASE ) 50 MCG/ACT nasal spray, Place 2 sprays into both nostrils daily., Disp: 16 g, Rfl: 6   Multiple Vitamin (MULTI-VITAMIN) tablet, Take 1 tablet by mouth daily., Disp: , Rfl:    nystatin -triamcinolone  ointment (MYCOLOG), Apply 1 application topically 2 (two) times daily., Disp: 60 g, Rfl: 0   PARoxetine  (PAXIL ) 20 MG tablet, Take 1 tablet (20 mg total) by mouth daily., Disp: 90 tablet, Rfl: 2   valsartan -hydrochlorothiazide  (DIOVAN -HCT) 160-25 MG tablet, TAKE 1 TABLET BY MOUTH EVERY DAY,  Disp: 90 tablet, Rfl: 0   aspirin EC 81 MG tablet, Take 81 mg by mouth daily. Every other day, Disp: , Rfl:    Objective:     BP 126/76 (BP Location: Right Arm, Patient Position: Sitting, Cuff Size: Normal)   Pulse 72   Temp 97.9 F (36.6 C) (Temporal)   Ht 5' 8 (1.727 m)   Wt 164 lb 12.8 oz (74.8 kg)   SpO2 96%   BMI 25.06 kg/m  {Vitals History (Optional):23777}  Physical Exam Constitutional:      General: He is not in acute distress.    Appearance: Normal appearance. He is not ill-appearing, toxic-appearing or diaphoretic.  HENT:     Head: Normocephalic and atraumatic.     Right Ear: External ear normal.     Left Ear: External ear normal.  Eyes:     General: No scleral icterus.       Right eye: No discharge.        Left eye: No discharge.     Extraocular Movements: Extraocular movements intact.     Conjunctiva/sclera: Conjunctivae normal.  Pulmonary:     Effort: Pulmonary effort is normal. No respiratory distress.  Skin:    General: Skin is warm and dry.  Neurological:     Mental Status: He is alert and oriented to person, place, and time.  Psychiatric:        Mood and Affect: Mood normal.  Behavior: Behavior normal.      No results found for any visits on 12/16/23.  {Labs (Optional):23779}  The 10-year ASCVD risk score (Arnett DK, et al., 2019) is: 20.9%    Assessment & Plan:   Essential hypertension -     Basic metabolic panel with GFR  Prediabetes -     Hemoglobin A1c -     Basic metabolic panel with GFR  Iron deficiency -     CBC -     Iron, TIBC and Ferritin Panel  Benign hematuria    Return in about 6 months (around 06/15/2024) for chronic disease follow-up, annual physical.  Information given on preventing type 2 diabetes.  Information given on a low-fat low-cholesterol diet.  Discussed the need for lower LDL cholesterol with a history of prediabetes.  Elsie Sim Lent, MD

## 2023-12-17 ENCOUNTER — Ambulatory Visit: Payer: Self-pay | Admitting: Family Medicine

## 2023-12-17 LAB — IRON,TIBC AND FERRITIN PANEL
%SAT: 12 % — ABNORMAL LOW (ref 20–48)
Ferritin: 318 ng/mL (ref 24–380)
Iron: 33 ug/dL — ABNORMAL LOW (ref 50–180)
TIBC: 280 ug/dL (ref 250–425)

## 2023-12-17 LAB — BASIC METABOLIC PANEL WITH GFR
BUN: 29 mg/dL — ABNORMAL HIGH (ref 6–23)
CO2: 29 meq/L (ref 19–32)
Calcium: 8.9 mg/dL (ref 8.4–10.5)
Chloride: 99 meq/L (ref 96–112)
Creatinine, Ser: 1.62 mg/dL — ABNORMAL HIGH (ref 0.40–1.50)
GFR: 42.73 mL/min — ABNORMAL LOW (ref >60.00)
Glucose, Bld: 89 mg/dL (ref 70–99)
Potassium: 4.4 meq/L (ref 3.5–5.1)
Sodium: 137 meq/L (ref 135–145)

## 2023-12-17 LAB — CBC
HCT: 34.9 % — ABNORMAL LOW (ref 39.0–52.0)
Hemoglobin: 11.7 g/dL — ABNORMAL LOW (ref 13.0–17.0)
MCHC: 33.5 g/dL (ref 30.0–36.0)
MCV: 90.2 fl (ref 78.0–100.0)
Platelets: 379 K/uL (ref 150.0–400.0)
RBC: 3.86 Mil/uL — ABNORMAL LOW (ref 4.22–5.81)
RDW: 14 % (ref 11.5–15.5)
WBC: 5.6 K/uL (ref 4.0–10.5)

## 2023-12-17 LAB — HEMOGLOBIN A1C: Hgb A1c MFr Bld: 6.5 % (ref 4.6–6.5)

## 2023-12-17 MED ORDER — IRON (FERROUS SULFATE) 325 (65 FE) MG PO TABS: 325.0000 mg | ORAL_TABLET | ORAL | 3 refills | Status: DC

## 2023-12-19 ENCOUNTER — Other Ambulatory Visit: Payer: Self-pay | Admitting: Family Medicine

## 2023-12-19 DIAGNOSIS — I10 Essential (primary) hypertension: Secondary | ICD-10-CM

## 2024-02-02 ENCOUNTER — Ambulatory Visit: Payer: Self-pay

## 2024-02-02 NOTE — Telephone Encounter (Signed)
 Noted. Scheduled 02/03/24 with Dr Berneta. Dm/cma

## 2024-02-02 NOTE — Telephone Encounter (Signed)
 FYI Only or Action Required?: FYI only for provider: appointment scheduled on 02/03/24.  Patient was last seen in primary care on 12/16/2023 by Berneta Elsie Sayre, MD.  Called Nurse Triage reporting Numbness.  Symptoms began several weeks ago.  Interventions attempted: Nothing.  Symptoms are: unchanged.  Triage Disposition: See Physician Within 24 Hours  Patient/caregiver understands and will follow disposition?: Yes  Pt calling to see PCP. Numbness in the hands, started a few weeks ago  Pharmacist, community  a month ago  Denioes pain   Reason for Disposition  Numbness (i.e., loss of sensation) in hand or fingers  (Exceptions: Just tingling; numbness present > 2 weeks.)  Answer Assessment - Initial Assessment Questions 1. ONSET: When did the pain start?     2-3 weeks 2. LOCATION: Where is the pain located?     Both hands 3. PAIN: How bad is the pain? (Scale 1-10; or mild, moderate, severe)     Denies pain 4. WORK OR EXERCISE: Has there been any recent work or exercise that involved this part (i.e., hand or wrist) of the body?     denies 5. CAUSE: What do you think is causing the pain?     Pt thinks maybe a pinched nerve 6. AGGRAVATING FACTORS: What makes the pain worse? (e.g., using computer)     No and does not go away 7. OTHER SYMPTOMS: Do you have any other symptoms? (e.g., fever, neck pain, numbness or tingling, rash, swelling)     denies  Protocols used: Hand Pain-A-AH

## 2024-02-03 ENCOUNTER — Ambulatory Visit: Admitting: Family Medicine

## 2024-02-03 ENCOUNTER — Encounter: Payer: Self-pay | Admitting: Family Medicine

## 2024-02-03 VITALS — BP 136/70 | HR 86 | Temp 98.8°F | Ht 68.0 in | Wt 163.6 lb

## 2024-02-03 DIAGNOSIS — E611 Iron deficiency: Secondary | ICD-10-CM

## 2024-02-03 DIAGNOSIS — R202 Paresthesia of skin: Secondary | ICD-10-CM | POA: Diagnosis not present

## 2024-02-03 DIAGNOSIS — F325 Major depressive disorder, single episode, in full remission: Secondary | ICD-10-CM

## 2024-02-03 DIAGNOSIS — R29898 Other symptoms and signs involving the musculoskeletal system: Secondary | ICD-10-CM | POA: Diagnosis not present

## 2024-02-03 DIAGNOSIS — I10 Essential (primary) hypertension: Secondary | ICD-10-CM | POA: Diagnosis not present

## 2024-02-03 MED ORDER — VALSARTAN-HYDROCHLOROTHIAZIDE 160-25 MG PO TABS: 1.0000 | ORAL_TABLET | Freq: Every day | ORAL | 0 refills | Status: AC

## 2024-02-03 MED ORDER — IRON (FERROUS SULFATE) 325 (65 FE) MG PO TABS: 325.0000 mg | ORAL_TABLET | ORAL | 3 refills | Status: AC

## 2024-02-03 MED ORDER — PAROXETINE HCL 20 MG PO TABS: 20.0000 mg | ORAL_TABLET | Freq: Every day | ORAL | 2 refills | Status: AC
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
## 2024-02-16 ENCOUNTER — Ambulatory Visit (HOSPITAL_BASED_OUTPATIENT_CLINIC_OR_DEPARTMENT_OTHER)
Admission: RE | Admit: 2024-02-16 | Discharge: 2024-02-16 | Disposition: A | Discharge status: Home / Self Care | Source: Ambulatory Visit | Attending: Family Medicine | Admitting: Family Medicine

## 2024-02-16 DIAGNOSIS — R202 Paresthesia of skin: Secondary | ICD-10-CM | POA: Insufficient documentation

## 2024-02-16 DIAGNOSIS — R29898 Other symptoms and signs involving the musculoskeletal system: Secondary | ICD-10-CM | POA: Insufficient documentation

## 2024-02-29 ENCOUNTER — Ambulatory Visit: Payer: Self-pay | Admitting: *Deleted
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
## 2024-02-29 NOTE — Telephone Encounter (Signed)
 Left VM to rtn call.   [place] BIRCH Cono [address] [place] [address] [phone]  Will give this to him when he returns the call. Dm/cma

## 2024-02-29 NOTE — Telephone Encounter (Signed)
 Patient notified VIA phone of number to call.    Can you please review CT results fir lumbar/cervical?  Please review and advise.  Thanks. Dm/cma

## 2024-03-01 ENCOUNTER — Telehealth: Payer: Self-pay | Admitting: Family Medicine

## 2024-03-01 NOTE — Telephone Encounter (Signed)
 Copied from CRM [id number]. Topic: Referral - Question >> Feb 29, 2024  4:58 PM Vena HERO wrote: Reason for CRM: Pt called in to let Dr Berneta know that the doctor at [facility] he was referred too (rebecca cono) no longer works at that facility and also [facility] does not do brain/spine so he needs a new facility and provider this referral. Per CRM # [phone] Please call pt with updated referral information

## 2024-03-01 NOTE — Telephone Encounter (Signed)
 Please see patient message about the referral and place a different referral for him.  Thanks.  Dm/cma

## 2024-03-02 NOTE — Addendum Note (Signed)
 Addended by: BERNETA ELSIE LABOR on: 03/02/2024 12:36 PM   Modules accepted: Orders

## 2024-03-03 NOTE — Telephone Encounter (Signed)
Lft VM to rtn call to advise .  Dm/cma

## 2024-03-06 NOTE — Telephone Encounter (Signed)
 Patient was aware and already has an appointment on 03/09/24 with them. Dm/cma

## 2024-03-08 ENCOUNTER — Ambulatory Visit: Admitting: Family

## 2024-03-08 DIAGNOSIS — M5416 Radiculopathy, lumbar region: Secondary | ICD-10-CM | POA: Diagnosis not present

## 2024-03-08 DIAGNOSIS — M5030 Other cervical disc degeneration, unspecified cervical region: Secondary | ICD-10-CM

## 2024-03-08 DIAGNOSIS — M542 Cervicalgia: Secondary | ICD-10-CM

## 2024-03-08 MED ORDER — PREDNISONE 50 MG PO TABS: ORAL_TABLET | ORAL | 0 refills | Status: AC
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
## 2024-03-09 ENCOUNTER — Encounter: Payer: Self-pay | Admitting: Neurosurgery

## 2024-03-21 ENCOUNTER — Telehealth: Payer: Self-pay | Admitting: Family Medicine

## 2024-03-21 DIAGNOSIS — R202 Paresthesia of skin: Secondary | ICD-10-CM

## 2024-03-21 DIAGNOSIS — R29898 Other symptoms and signs involving the musculoskeletal system: Secondary | ICD-10-CM

## 2024-03-22 ENCOUNTER — Ambulatory Visit: Payer: Self-pay

## 2024-03-22 NOTE — Telephone Encounter (Signed)
 Spoke with pt he stated that he had seen Dr. Berneta for this numbness he sent him to ortho and had ct scan done he is now wanting a referral to neurology [facility]rology - [facility]

## 2024-03-22 NOTE — Telephone Encounter (Signed)
 I called and spoke with patient and notified him that a neurology referral was placed.

## 2024-03-22 NOTE — Telephone Encounter (Signed)
Left VM to rtn call. Dm/cma       

## 2024-03-22 NOTE — Telephone Encounter (Signed)
 Called pt and wife no answer unable to LVM wanted to check on pt about the numbness he was having

## 2024-03-22 NOTE — Addendum Note (Signed)
 Addended by: GLADIS CLAUDENE GRATE Y on: 03/22/2024 02:19 PM   Modules accepted: Orders
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
## 2024-03-22 NOTE — Telephone Encounter (Signed)
 Copied from CRM [id number]. Topic: Referral - Prior Authorization Question >> Mar 20, 2024 12:09 PM Robinson H wrote: Reason for CRM: Patient states Dr. Berneta referred him to Ortho, Ortho in returned is sending patient to [facility] but patient states they won't see him because he stopped going to their pain management clinic a couple years ago. Stating now he needs a referral sent to [facility] fax [phone]. Agent asked patient if he could get referral from Ortho doctor since that provider is the one that wants him to see Neurology and patient states he was told referral has to come from primary provider. Wants to know if Dr. Berneta can send in referral or does Ortho have to.  Delmar [phone] >> Mar 22, 2024  9:56 AM Alfonso ORN wrote: pt called to check on referral request for [facility] neurology as he his numbness is getting worse and unable to lift arm, fell a week . Advised timeframe for referral. Please follow up with referral

## 2024-03-23 NOTE — Telephone Encounter (Signed)
 LVM with pt about the referral and number to call and schedule appointment. Any question pt can call office.

## 2024-06-15 ENCOUNTER — Encounter: Admitting: Family Medicine
